# Patient Record
Sex: Female | Born: 1975
Health system: Southern US, Community
[De-identification: ages and names within clinical notes are randomized; demographics above are authoritative.]

## PROBLEM LIST (undated history)

## (undated) DIAGNOSIS — F419 Anxiety disorder, unspecified: Secondary | ICD-10-CM

## (undated) HISTORY — PX: ABLATION: SHX5711

## (undated) HISTORY — DX: Anxiety disorder, unspecified: F41.9

## (undated) HISTORY — PX: ABDOMINAL HYSTERECTOMY: SHX81

## (undated) HISTORY — PX: FOOT SURGERY: SHX648

---

## 2006-10-18 ENCOUNTER — Ambulatory Visit: Payer: Self-pay | Admitting: Podiatry

## 2007-11-17 ENCOUNTER — Encounter: Payer: Self-pay | Admitting: Obstetrics and Gynecology

## 2007-12-29 ENCOUNTER — Encounter: Payer: Self-pay | Admitting: Obstetrics and Gynecology

## 2008-01-13 ENCOUNTER — Ambulatory Visit: Payer: Self-pay | Admitting: Certified Nurse Midwife

## 2008-01-15 ENCOUNTER — Ambulatory Visit: Payer: Self-pay | Admitting: Maternal & Fetal Medicine

## 2008-03-22 ENCOUNTER — Ambulatory Visit: Payer: Self-pay

## 2008-04-29 ENCOUNTER — Encounter: Payer: Self-pay | Admitting: Maternal & Fetal Medicine

## 2008-05-28 ENCOUNTER — Inpatient Hospital Stay: Payer: Self-pay | Admitting: Obstetrics and Gynecology

## 2009-09-02 ENCOUNTER — Emergency Department: Payer: Self-pay | Admitting: Emergency Medicine

## 2010-04-15 ENCOUNTER — Ambulatory Visit: Payer: Self-pay | Admitting: Unknown Physician Specialty

## 2010-05-08 ENCOUNTER — Ambulatory Visit: Payer: Self-pay

## 2010-07-06 ENCOUNTER — Inpatient Hospital Stay: Payer: Self-pay

## 2010-07-11 LAB — PATHOLOGY REPORT

## 2012-04-20 ENCOUNTER — Emergency Department: Payer: Self-pay | Admitting: Emergency Medicine

## 2012-05-28 DIAGNOSIS — D239 Other benign neoplasm of skin, unspecified: Secondary | ICD-10-CM

## 2012-05-28 HISTORY — DX: Other benign neoplasm of skin, unspecified: D23.9

## 2013-12-31 HISTORY — PX: HYSTERECTOMY ABDOMINAL WITH SALPINGECTOMY: SHX6725

## 2014-11-22 ENCOUNTER — Ambulatory Visit: Payer: Self-pay | Admitting: Obstetrics & Gynecology

## 2014-11-22 LAB — CBC
HCT: 39 % (ref 35.0–47.0)
HGB: 12.7 g/dL (ref 12.0–16.0)
MCH: 31.1 pg (ref 26.0–34.0)
MCHC: 32.7 g/dL (ref 32.0–36.0)
MCV: 95 fL (ref 80–100)
Platelet: 208 10*3/uL (ref 150–440)
RBC: 4.1 10*6/uL (ref 3.80–5.20)
RDW: 13 % (ref 11.5–14.5)
WBC: 8.2 10*3/uL (ref 3.6–11.0)

## 2014-12-03 ENCOUNTER — Ambulatory Visit: Payer: Self-pay | Admitting: Obstetrics & Gynecology

## 2014-12-04 LAB — HEMOGLOBIN: HGB: 10.6 g/dL — ABNORMAL LOW (ref 12.0–16.0)

## 2015-04-23 NOTE — Op Note (Signed)
PATIENT NAME:  Vanessa Kim, Vanessa Kim MR#:  [id number] DATE OF BIRTH:  [birth year]  DATE OF PROCEDURE:  12/03/2014  PREOPERATIVE DIAGNOSIS: Chronic pelvic pain.  POSTOPERATIVE DIAGNOSIS: Chronic pelvic pain.   PROCEDURE PERFORMED: Complete laparoscopic hysterectomy with bilateral salpingectomy, and cystoscopy.   SURGEON: Glean Salen, MD   ASSISTANT: Glennon Mac   ANESTHESIA: General.   ESTIMATED BLOOD LOSS: 810 mL   COMPLICATIONS: None.   FINDINGS: Right hydrosalpinx, left ovarian cyst. Normal cystoscopy with patent flow out each ureter.   DISPOSITION: To the recovery room stable.   TECHNIQUE: The patient was prepped and draped in the usual sterile fashion. After adequate anesthesia was obtained, in the dorsal lithotomy position, a bladder Foley catheter was placed and a speculum was then applied vaginally, with grasping of the cervix with a tenaculum. A V-Care device was placed, after the uterus was sounded to 8 cm.   Attention was then turned to the abdomen, where a Veress needle was inserted through a 5 mm infraumbilical incision and Marcaine was used to anesthetize the skin. Veress needle placement was confirmed using the hanging drop technique, and the abdomen was then insufflated with CO2 gas. A 5 mm trocar was then inserted under direct visualization with the laparoscope, with no injuries or bleeding noted.   The patient is placed in Trendelenburg positioning. The above-mentioned pathological findings were visualized. A small perforation of the uterus with the V-Care device was visualized, but there was no bleeding, injury, or lateral injury noted; it was confined to the uterus.   Right 11 mm incision and trocar was placed, and a left 5 mm incision and trocar was placed for laparoscopic instrumentation purposes. Using the 5 mm Harmonic scalpel, the fallopian tube on each side was carefully dissected free from the mesosalpinx, and away from the infundibulopelvic blood vessels and ligaments,  without compromise of the blood flow to each ovary. The round ligaments were then carefully coagulated and cut. The uterine ovarian blood vessels and ligaments were then carefully coagulated and cut, to completely sever the ovary from the uterus, but with preserving the ovaries and their blood supplies. The dissection was carried down to the level of the uterine arteries, which were carefully coagulated with the Kleppinger bipolar cautery device and then cut at the level of the VCare device. Visualization of the ureter, as well as bowel and colon, reveals no injury or harm. The V-Care device was then identify at the cervical vaginal junction, and was carefully incised using a Harmonic scalpel to completely amputate the uterus, cervix, and fallopian tubes, which was then removed vaginally.   Using the Endo Stitch device, the vaginal cuff was closed with 0 Polysorb sutures in an interrupted fashion. Examination vaginally with the speculum exam reveals a small area at the inferior pole of the incision with separation and escape of gas, and so a vaginal suture using 2 Vicryl sutures was then placed to complete the closure. While vaginal, the Foley catheter was taken out, and a cystoscopy was performed with saline distention of the bladder. Urine flow was seen to emit from each ureteral orifice on the right and left sides. The cystoscope was removed, and Foley catheter was replaced.   The pelvic cavity was irrigated with saline with aspiration of all fluid, and excellent hemostasis was noted. Arista was placed on several areas around the vaginal cuff, and just lateral to that, to ensure adequate hemostasis. The patient was leveled. Gas was expelled. Trocars were removed and the abdominal incisions were closed  with a skin glue preparation. The Foley catheter was left in place.   The patient went to the recovery room in stable condition. Specimen was sent to pathology for further review. The patient tolerated  procedure well and went to the recovery room in stable condition. All sponge, instrument, and needle counts were correct.    ____________________________ R. Barnett Applebaum, MD rph:MT D: 12/03/2014 12:40:02 ET T: 12/03/2014 13:00:00 ET JOB#: [id number]  cc: Glean Salen, MD, <Dictator> Gae Dry MD ELECTRONICALLY SIGNED 12/04/2014 9:29

## 2015-04-25 LAB — SURGICAL PATHOLOGY

## 2015-11-28 ENCOUNTER — Encounter: Payer: Self-pay | Admitting: Physician Assistant

## 2015-11-28 ENCOUNTER — Ambulatory Visit: Payer: Self-pay | Admitting: Physician Assistant

## 2015-11-28 VITALS — BP 140/98 | HR 86 | Temp 98.3°F

## 2015-11-28 DIAGNOSIS — J069 Acute upper respiratory infection, unspecified: Secondary | ICD-10-CM

## 2015-11-28 MED ORDER — AZITHROMYCIN 250 MG PO TABS: ORAL_TABLET | ORAL | Status: DC

## 2015-11-28 MED ORDER — PREDNISONE 10 MG PO TABS: 30.0000 mg | ORAL_TABLET | Freq: Every day | ORAL | Status: DC

## 2015-11-28 NOTE — Progress Notes (Signed)
S: C/o runny nose and congestion for 3 days, no fever, chills, cp/sob, v/d; mucus is green and thick, c/o of facial and dental pain.   Using otc meds:   O: PE: perrl eomi, normocephalic, tms dull, nasal mucosa red and swollen, throat injected, neck supple no lymph, lungs c t a, cv rrr, neuro intact  A:  Acute sinusitis   P: zpack, prednisone 30mg  qd x 3d,  drink fluids, continue regular meds , use otc meds of choice, return if not improving in 5 days, return earlier if worsening

## 2016-01-10 DIAGNOSIS — J301 Allergic rhinitis due to pollen: Secondary | ICD-10-CM | POA: Diagnosis not present

## 2016-01-10 DIAGNOSIS — J3081 Allergic rhinitis due to animal (cat) (dog) hair and dander: Secondary | ICD-10-CM | POA: Diagnosis not present

## 2016-01-10 DIAGNOSIS — J3089 Other allergic rhinitis: Secondary | ICD-10-CM | POA: Diagnosis not present

## 2016-01-12 DIAGNOSIS — J3081 Allergic rhinitis due to animal (cat) (dog) hair and dander: Secondary | ICD-10-CM | POA: Diagnosis not present

## 2016-01-19 DIAGNOSIS — J301 Allergic rhinitis due to pollen: Secondary | ICD-10-CM | POA: Diagnosis not present

## 2016-01-19 DIAGNOSIS — J3089 Other allergic rhinitis: Secondary | ICD-10-CM | POA: Diagnosis not present

## 2016-01-19 DIAGNOSIS — J3081 Allergic rhinitis due to animal (cat) (dog) hair and dander: Secondary | ICD-10-CM | POA: Diagnosis not present

## 2016-01-23 DIAGNOSIS — J3081 Allergic rhinitis due to animal (cat) (dog) hair and dander: Secondary | ICD-10-CM | POA: Diagnosis not present

## 2016-01-23 DIAGNOSIS — J3089 Other allergic rhinitis: Secondary | ICD-10-CM | POA: Diagnosis not present

## 2016-01-23 DIAGNOSIS — J301 Allergic rhinitis due to pollen: Secondary | ICD-10-CM | POA: Diagnosis not present

## 2016-01-26 DIAGNOSIS — J3081 Allergic rhinitis due to animal (cat) (dog) hair and dander: Secondary | ICD-10-CM | POA: Diagnosis not present

## 2016-01-26 DIAGNOSIS — J301 Allergic rhinitis due to pollen: Secondary | ICD-10-CM | POA: Diagnosis not present

## 2016-01-26 DIAGNOSIS — J3089 Other allergic rhinitis: Secondary | ICD-10-CM | POA: Diagnosis not present

## 2016-01-31 DIAGNOSIS — J3081 Allergic rhinitis due to animal (cat) (dog) hair and dander: Secondary | ICD-10-CM | POA: Diagnosis not present

## 2016-01-31 DIAGNOSIS — J301 Allergic rhinitis due to pollen: Secondary | ICD-10-CM | POA: Diagnosis not present

## 2016-01-31 DIAGNOSIS — J3089 Other allergic rhinitis: Secondary | ICD-10-CM | POA: Diagnosis not present

## 2016-02-07 DIAGNOSIS — J301 Allergic rhinitis due to pollen: Secondary | ICD-10-CM | POA: Diagnosis not present

## 2016-02-07 DIAGNOSIS — J3081 Allergic rhinitis due to animal (cat) (dog) hair and dander: Secondary | ICD-10-CM | POA: Diagnosis not present

## 2016-02-07 DIAGNOSIS — J3089 Other allergic rhinitis: Secondary | ICD-10-CM | POA: Diagnosis not present

## 2016-02-14 DIAGNOSIS — F411 Generalized anxiety disorder: Secondary | ICD-10-CM | POA: Diagnosis not present

## 2016-02-14 DIAGNOSIS — J3081 Allergic rhinitis due to animal (cat) (dog) hair and dander: Secondary | ICD-10-CM | POA: Diagnosis not present

## 2016-02-14 DIAGNOSIS — J301 Allergic rhinitis due to pollen: Secondary | ICD-10-CM | POA: Diagnosis not present

## 2016-02-14 DIAGNOSIS — J3089 Other allergic rhinitis: Secondary | ICD-10-CM | POA: Diagnosis not present

## 2016-02-14 DIAGNOSIS — F988 Other specified behavioral and emotional disorders with onset usually occurring in childhood and adolescence: Secondary | ICD-10-CM | POA: Diagnosis not present

## 2016-02-23 DIAGNOSIS — J301 Allergic rhinitis due to pollen: Secondary | ICD-10-CM | POA: Diagnosis not present

## 2016-02-23 DIAGNOSIS — J3089 Other allergic rhinitis: Secondary | ICD-10-CM | POA: Diagnosis not present

## 2016-02-23 DIAGNOSIS — J3081 Allergic rhinitis due to animal (cat) (dog) hair and dander: Secondary | ICD-10-CM | POA: Diagnosis not present

## 2016-02-28 DIAGNOSIS — J3081 Allergic rhinitis due to animal (cat) (dog) hair and dander: Secondary | ICD-10-CM | POA: Diagnosis not present

## 2016-02-28 DIAGNOSIS — J3089 Other allergic rhinitis: Secondary | ICD-10-CM | POA: Diagnosis not present

## 2016-02-28 DIAGNOSIS — J301 Allergic rhinitis due to pollen: Secondary | ICD-10-CM | POA: Diagnosis not present

## 2016-03-08 DIAGNOSIS — J301 Allergic rhinitis due to pollen: Secondary | ICD-10-CM | POA: Diagnosis not present

## 2016-03-08 DIAGNOSIS — H1045 Other chronic allergic conjunctivitis: Secondary | ICD-10-CM | POA: Diagnosis not present

## 2016-03-08 DIAGNOSIS — J3081 Allergic rhinitis due to animal (cat) (dog) hair and dander: Secondary | ICD-10-CM | POA: Diagnosis not present

## 2016-03-08 DIAGNOSIS — J3089 Other allergic rhinitis: Secondary | ICD-10-CM | POA: Diagnosis not present

## 2016-03-13 DIAGNOSIS — J301 Allergic rhinitis due to pollen: Secondary | ICD-10-CM | POA: Diagnosis not present

## 2016-03-13 DIAGNOSIS — J3081 Allergic rhinitis due to animal (cat) (dog) hair and dander: Secondary | ICD-10-CM | POA: Diagnosis not present

## 2016-03-13 DIAGNOSIS — J3089 Other allergic rhinitis: Secondary | ICD-10-CM | POA: Diagnosis not present

## 2016-03-22 DIAGNOSIS — J3081 Allergic rhinitis due to animal (cat) (dog) hair and dander: Secondary | ICD-10-CM | POA: Diagnosis not present

## 2016-03-22 DIAGNOSIS — J301 Allergic rhinitis due to pollen: Secondary | ICD-10-CM | POA: Diagnosis not present

## 2016-03-22 DIAGNOSIS — J3089 Other allergic rhinitis: Secondary | ICD-10-CM | POA: Diagnosis not present

## 2016-03-27 DIAGNOSIS — J3081 Allergic rhinitis due to animal (cat) (dog) hair and dander: Secondary | ICD-10-CM | POA: Diagnosis not present

## 2016-03-27 DIAGNOSIS — J301 Allergic rhinitis due to pollen: Secondary | ICD-10-CM | POA: Diagnosis not present

## 2016-03-27 DIAGNOSIS — J3089 Other allergic rhinitis: Secondary | ICD-10-CM | POA: Diagnosis not present

## 2016-04-05 DIAGNOSIS — J3089 Other allergic rhinitis: Secondary | ICD-10-CM | POA: Diagnosis not present

## 2016-04-05 DIAGNOSIS — J301 Allergic rhinitis due to pollen: Secondary | ICD-10-CM | POA: Diagnosis not present

## 2016-04-05 DIAGNOSIS — J3081 Allergic rhinitis due to animal (cat) (dog) hair and dander: Secondary | ICD-10-CM | POA: Diagnosis not present

## 2016-04-11 ENCOUNTER — Encounter: Payer: Self-pay | Admitting: *Deleted

## 2016-04-12 DIAGNOSIS — J3081 Allergic rhinitis due to animal (cat) (dog) hair and dander: Secondary | ICD-10-CM | POA: Diagnosis not present

## 2016-04-12 DIAGNOSIS — J3089 Other allergic rhinitis: Secondary | ICD-10-CM | POA: Diagnosis not present

## 2016-04-12 DIAGNOSIS — J301 Allergic rhinitis due to pollen: Secondary | ICD-10-CM | POA: Diagnosis not present

## 2016-04-17 DIAGNOSIS — J301 Allergic rhinitis due to pollen: Secondary | ICD-10-CM | POA: Diagnosis not present

## 2016-04-17 DIAGNOSIS — J3089 Other allergic rhinitis: Secondary | ICD-10-CM | POA: Diagnosis not present

## 2016-04-17 DIAGNOSIS — J3081 Allergic rhinitis due to animal (cat) (dog) hair and dander: Secondary | ICD-10-CM | POA: Diagnosis not present

## 2016-04-24 DIAGNOSIS — J3089 Other allergic rhinitis: Secondary | ICD-10-CM | POA: Diagnosis not present

## 2016-04-24 DIAGNOSIS — J3081 Allergic rhinitis due to animal (cat) (dog) hair and dander: Secondary | ICD-10-CM | POA: Diagnosis not present

## 2016-04-24 DIAGNOSIS — J301 Allergic rhinitis due to pollen: Secondary | ICD-10-CM | POA: Diagnosis not present

## 2016-05-04 DIAGNOSIS — J301 Allergic rhinitis due to pollen: Secondary | ICD-10-CM | POA: Diagnosis not present

## 2016-05-04 DIAGNOSIS — J3089 Other allergic rhinitis: Secondary | ICD-10-CM | POA: Diagnosis not present

## 2016-05-04 DIAGNOSIS — J3081 Allergic rhinitis due to animal (cat) (dog) hair and dander: Secondary | ICD-10-CM | POA: Diagnosis not present

## 2016-05-08 DIAGNOSIS — J301 Allergic rhinitis due to pollen: Secondary | ICD-10-CM | POA: Diagnosis not present

## 2016-05-08 DIAGNOSIS — J3081 Allergic rhinitis due to animal (cat) (dog) hair and dander: Secondary | ICD-10-CM | POA: Diagnosis not present

## 2016-05-08 DIAGNOSIS — J3089 Other allergic rhinitis: Secondary | ICD-10-CM | POA: Diagnosis not present

## 2016-05-17 DIAGNOSIS — J301 Allergic rhinitis due to pollen: Secondary | ICD-10-CM | POA: Diagnosis not present

## 2016-05-17 DIAGNOSIS — J3081 Allergic rhinitis due to animal (cat) (dog) hair and dander: Secondary | ICD-10-CM | POA: Diagnosis not present

## 2016-05-17 DIAGNOSIS — J3089 Other allergic rhinitis: Secondary | ICD-10-CM | POA: Diagnosis not present

## 2016-05-22 DIAGNOSIS — J3081 Allergic rhinitis due to animal (cat) (dog) hair and dander: Secondary | ICD-10-CM | POA: Diagnosis not present

## 2016-05-22 DIAGNOSIS — J301 Allergic rhinitis due to pollen: Secondary | ICD-10-CM | POA: Diagnosis not present

## 2016-05-22 DIAGNOSIS — J3089 Other allergic rhinitis: Secondary | ICD-10-CM | POA: Diagnosis not present

## 2016-06-01 DIAGNOSIS — J3089 Other allergic rhinitis: Secondary | ICD-10-CM | POA: Diagnosis not present

## 2016-06-01 DIAGNOSIS — J301 Allergic rhinitis due to pollen: Secondary | ICD-10-CM | POA: Diagnosis not present

## 2016-06-01 DIAGNOSIS — J3081 Allergic rhinitis due to animal (cat) (dog) hair and dander: Secondary | ICD-10-CM | POA: Diagnosis not present

## 2016-06-07 DIAGNOSIS — R5383 Other fatigue: Secondary | ICD-10-CM | POA: Diagnosis not present

## 2016-06-07 DIAGNOSIS — J301 Allergic rhinitis due to pollen: Secondary | ICD-10-CM | POA: Diagnosis not present

## 2016-06-07 DIAGNOSIS — J3089 Other allergic rhinitis: Secondary | ICD-10-CM | POA: Diagnosis not present

## 2016-06-07 DIAGNOSIS — J3081 Allergic rhinitis due to animal (cat) (dog) hair and dander: Secondary | ICD-10-CM | POA: Diagnosis not present

## 2016-07-05 DIAGNOSIS — J3089 Other allergic rhinitis: Secondary | ICD-10-CM | POA: Diagnosis not present

## 2016-07-05 DIAGNOSIS — J3081 Allergic rhinitis due to animal (cat) (dog) hair and dander: Secondary | ICD-10-CM | POA: Diagnosis not present

## 2016-07-05 DIAGNOSIS — J301 Allergic rhinitis due to pollen: Secondary | ICD-10-CM | POA: Diagnosis not present

## 2016-07-09 DIAGNOSIS — J3081 Allergic rhinitis due to animal (cat) (dog) hair and dander: Secondary | ICD-10-CM | POA: Diagnosis not present

## 2016-07-09 DIAGNOSIS — J3089 Other allergic rhinitis: Secondary | ICD-10-CM | POA: Diagnosis not present

## 2016-07-10 DIAGNOSIS — J3081 Allergic rhinitis due to animal (cat) (dog) hair and dander: Secondary | ICD-10-CM | POA: Diagnosis not present

## 2016-07-10 DIAGNOSIS — J3089 Other allergic rhinitis: Secondary | ICD-10-CM | POA: Diagnosis not present

## 2016-07-10 DIAGNOSIS — J301 Allergic rhinitis due to pollen: Secondary | ICD-10-CM | POA: Diagnosis not present

## 2016-07-20 DIAGNOSIS — J301 Allergic rhinitis due to pollen: Secondary | ICD-10-CM | POA: Diagnosis not present

## 2016-07-20 DIAGNOSIS — J3089 Other allergic rhinitis: Secondary | ICD-10-CM | POA: Diagnosis not present

## 2016-07-20 DIAGNOSIS — J3081 Allergic rhinitis due to animal (cat) (dog) hair and dander: Secondary | ICD-10-CM | POA: Diagnosis not present

## 2016-07-26 DIAGNOSIS — J3081 Allergic rhinitis due to animal (cat) (dog) hair and dander: Secondary | ICD-10-CM | POA: Diagnosis not present

## 2016-07-26 DIAGNOSIS — J301 Allergic rhinitis due to pollen: Secondary | ICD-10-CM | POA: Diagnosis not present

## 2016-07-26 DIAGNOSIS — J3089 Other allergic rhinitis: Secondary | ICD-10-CM | POA: Diagnosis not present

## 2016-07-29 ENCOUNTER — Telehealth: Payer: Self-pay | Admitting: Family

## 2016-07-29 DIAGNOSIS — J019 Acute sinusitis, unspecified: Secondary | ICD-10-CM

## 2016-07-29 MED ORDER — AMOXICILLIN-POT CLAVULANATE 875-125 MG PO TABS: 1.0000 | ORAL_TABLET | Freq: Two times a day (BID) | ORAL | 0 refills | Status: DC

## 2016-07-29 NOTE — Progress Notes (Signed)

## 2016-08-09 DIAGNOSIS — J3081 Allergic rhinitis due to animal (cat) (dog) hair and dander: Secondary | ICD-10-CM | POA: Diagnosis not present

## 2016-08-09 DIAGNOSIS — J3089 Other allergic rhinitis: Secondary | ICD-10-CM | POA: Diagnosis not present

## 2016-08-09 DIAGNOSIS — J301 Allergic rhinitis due to pollen: Secondary | ICD-10-CM | POA: Diagnosis not present

## 2016-08-14 DIAGNOSIS — J3081 Allergic rhinitis due to animal (cat) (dog) hair and dander: Secondary | ICD-10-CM | POA: Diagnosis not present

## 2016-08-14 DIAGNOSIS — J3089 Other allergic rhinitis: Secondary | ICD-10-CM | POA: Diagnosis not present

## 2016-08-14 DIAGNOSIS — J301 Allergic rhinitis due to pollen: Secondary | ICD-10-CM | POA: Diagnosis not present

## 2016-08-17 DIAGNOSIS — J3081 Allergic rhinitis due to animal (cat) (dog) hair and dander: Secondary | ICD-10-CM | POA: Diagnosis not present

## 2016-08-17 DIAGNOSIS — J3089 Other allergic rhinitis: Secondary | ICD-10-CM | POA: Diagnosis not present

## 2016-08-17 DIAGNOSIS — J301 Allergic rhinitis due to pollen: Secondary | ICD-10-CM | POA: Diagnosis not present

## 2016-08-23 DIAGNOSIS — J301 Allergic rhinitis due to pollen: Secondary | ICD-10-CM | POA: Diagnosis not present

## 2016-08-23 DIAGNOSIS — J3089 Other allergic rhinitis: Secondary | ICD-10-CM | POA: Diagnosis not present

## 2016-08-23 DIAGNOSIS — J3081 Allergic rhinitis due to animal (cat) (dog) hair and dander: Secondary | ICD-10-CM | POA: Diagnosis not present

## 2016-08-31 DIAGNOSIS — J301 Allergic rhinitis due to pollen: Secondary | ICD-10-CM | POA: Diagnosis not present

## 2016-08-31 DIAGNOSIS — J3089 Other allergic rhinitis: Secondary | ICD-10-CM | POA: Diagnosis not present

## 2016-08-31 DIAGNOSIS — J3081 Allergic rhinitis due to animal (cat) (dog) hair and dander: Secondary | ICD-10-CM | POA: Diagnosis not present

## 2016-09-06 DIAGNOSIS — J301 Allergic rhinitis due to pollen: Secondary | ICD-10-CM | POA: Diagnosis not present

## 2016-09-06 DIAGNOSIS — J3089 Other allergic rhinitis: Secondary | ICD-10-CM | POA: Diagnosis not present

## 2016-09-06 DIAGNOSIS — J3081 Allergic rhinitis due to animal (cat) (dog) hair and dander: Secondary | ICD-10-CM | POA: Diagnosis not present

## 2016-09-11 DIAGNOSIS — J3081 Allergic rhinitis due to animal (cat) (dog) hair and dander: Secondary | ICD-10-CM | POA: Diagnosis not present

## 2016-09-11 DIAGNOSIS — J3089 Other allergic rhinitis: Secondary | ICD-10-CM | POA: Diagnosis not present

## 2016-09-11 DIAGNOSIS — J301 Allergic rhinitis due to pollen: Secondary | ICD-10-CM | POA: Diagnosis not present

## 2016-09-14 DIAGNOSIS — J3081 Allergic rhinitis due to animal (cat) (dog) hair and dander: Secondary | ICD-10-CM | POA: Diagnosis not present

## 2016-09-14 DIAGNOSIS — J3089 Other allergic rhinitis: Secondary | ICD-10-CM | POA: Diagnosis not present

## 2016-09-14 DIAGNOSIS — J301 Allergic rhinitis due to pollen: Secondary | ICD-10-CM | POA: Diagnosis not present

## 2016-09-21 DIAGNOSIS — J301 Allergic rhinitis due to pollen: Secondary | ICD-10-CM | POA: Diagnosis not present

## 2016-09-21 DIAGNOSIS — J3089 Other allergic rhinitis: Secondary | ICD-10-CM | POA: Diagnosis not present

## 2016-09-21 DIAGNOSIS — J3081 Allergic rhinitis due to animal (cat) (dog) hair and dander: Secondary | ICD-10-CM | POA: Diagnosis not present

## 2016-09-25 DIAGNOSIS — J301 Allergic rhinitis due to pollen: Secondary | ICD-10-CM | POA: Diagnosis not present

## 2016-09-25 DIAGNOSIS — J3081 Allergic rhinitis due to animal (cat) (dog) hair and dander: Secondary | ICD-10-CM | POA: Diagnosis not present

## 2016-09-25 DIAGNOSIS — J3089 Other allergic rhinitis: Secondary | ICD-10-CM | POA: Diagnosis not present

## 2016-10-05 DIAGNOSIS — J3081 Allergic rhinitis due to animal (cat) (dog) hair and dander: Secondary | ICD-10-CM | POA: Diagnosis not present

## 2016-10-05 DIAGNOSIS — J301 Allergic rhinitis due to pollen: Secondary | ICD-10-CM | POA: Diagnosis not present

## 2016-10-05 DIAGNOSIS — J3089 Other allergic rhinitis: Secondary | ICD-10-CM | POA: Diagnosis not present

## 2016-10-09 DIAGNOSIS — J3081 Allergic rhinitis due to animal (cat) (dog) hair and dander: Secondary | ICD-10-CM | POA: Diagnosis not present

## 2016-10-09 DIAGNOSIS — J3089 Other allergic rhinitis: Secondary | ICD-10-CM | POA: Diagnosis not present

## 2016-10-09 DIAGNOSIS — J301 Allergic rhinitis due to pollen: Secondary | ICD-10-CM | POA: Diagnosis not present

## 2016-10-11 DIAGNOSIS — J301 Allergic rhinitis due to pollen: Secondary | ICD-10-CM | POA: Diagnosis not present

## 2016-10-11 DIAGNOSIS — J3089 Other allergic rhinitis: Secondary | ICD-10-CM | POA: Diagnosis not present

## 2016-10-11 DIAGNOSIS — J3081 Allergic rhinitis due to animal (cat) (dog) hair and dander: Secondary | ICD-10-CM | POA: Diagnosis not present

## 2016-10-18 DIAGNOSIS — J301 Allergic rhinitis due to pollen: Secondary | ICD-10-CM | POA: Diagnosis not present

## 2016-10-18 DIAGNOSIS — J3089 Other allergic rhinitis: Secondary | ICD-10-CM | POA: Diagnosis not present

## 2016-10-18 DIAGNOSIS — J3081 Allergic rhinitis due to animal (cat) (dog) hair and dander: Secondary | ICD-10-CM | POA: Diagnosis not present

## 2016-10-25 DIAGNOSIS — J301 Allergic rhinitis due to pollen: Secondary | ICD-10-CM | POA: Diagnosis not present

## 2016-10-25 DIAGNOSIS — J3089 Other allergic rhinitis: Secondary | ICD-10-CM | POA: Diagnosis not present

## 2016-11-02 DIAGNOSIS — J3089 Other allergic rhinitis: Secondary | ICD-10-CM | POA: Diagnosis not present

## 2016-11-02 DIAGNOSIS — J3081 Allergic rhinitis due to animal (cat) (dog) hair and dander: Secondary | ICD-10-CM | POA: Diagnosis not present

## 2016-11-02 DIAGNOSIS — J301 Allergic rhinitis due to pollen: Secondary | ICD-10-CM | POA: Diagnosis not present

## 2016-11-06 DIAGNOSIS — J301 Allergic rhinitis due to pollen: Secondary | ICD-10-CM | POA: Diagnosis not present

## 2016-11-06 DIAGNOSIS — J3089 Other allergic rhinitis: Secondary | ICD-10-CM | POA: Diagnosis not present

## 2016-11-06 DIAGNOSIS — J3081 Allergic rhinitis due to animal (cat) (dog) hair and dander: Secondary | ICD-10-CM | POA: Diagnosis not present

## 2016-11-20 DIAGNOSIS — J3089 Other allergic rhinitis: Secondary | ICD-10-CM | POA: Diagnosis not present

## 2016-11-20 DIAGNOSIS — J3081 Allergic rhinitis due to animal (cat) (dog) hair and dander: Secondary | ICD-10-CM | POA: Diagnosis not present

## 2016-11-20 DIAGNOSIS — J301 Allergic rhinitis due to pollen: Secondary | ICD-10-CM | POA: Diagnosis not present

## 2016-12-04 DIAGNOSIS — J3081 Allergic rhinitis due to animal (cat) (dog) hair and dander: Secondary | ICD-10-CM | POA: Diagnosis not present

## 2016-12-04 DIAGNOSIS — J301 Allergic rhinitis due to pollen: Secondary | ICD-10-CM | POA: Diagnosis not present

## 2016-12-04 DIAGNOSIS — J3089 Other allergic rhinitis: Secondary | ICD-10-CM | POA: Diagnosis not present

## 2016-12-13 DIAGNOSIS — J301 Allergic rhinitis due to pollen: Secondary | ICD-10-CM | POA: Diagnosis not present

## 2016-12-13 DIAGNOSIS — J3081 Allergic rhinitis due to animal (cat) (dog) hair and dander: Secondary | ICD-10-CM | POA: Diagnosis not present

## 2016-12-13 DIAGNOSIS — J3089 Other allergic rhinitis: Secondary | ICD-10-CM | POA: Diagnosis not present

## 2016-12-20 DIAGNOSIS — J301 Allergic rhinitis due to pollen: Secondary | ICD-10-CM | POA: Diagnosis not present

## 2016-12-20 DIAGNOSIS — J3081 Allergic rhinitis due to animal (cat) (dog) hair and dander: Secondary | ICD-10-CM | POA: Diagnosis not present

## 2016-12-20 DIAGNOSIS — J3089 Other allergic rhinitis: Secondary | ICD-10-CM | POA: Diagnosis not present

## 2017-01-02 ENCOUNTER — Telehealth: Payer: Self-pay | Admitting: Family

## 2017-01-02 DIAGNOSIS — J019 Acute sinusitis, unspecified: Secondary | ICD-10-CM

## 2017-01-02 MED ORDER — AMOXICILLIN-POT CLAVULANATE 875-125 MG PO TABS: 1.0000 | ORAL_TABLET | Freq: Two times a day (BID) | ORAL | 0 refills | Status: DC

## 2017-01-02 NOTE — Progress Notes (Signed)

## 2017-01-11 DIAGNOSIS — J3081 Allergic rhinitis due to animal (cat) (dog) hair and dander: Secondary | ICD-10-CM | POA: Diagnosis not present

## 2017-01-11 DIAGNOSIS — J301 Allergic rhinitis due to pollen: Secondary | ICD-10-CM | POA: Diagnosis not present

## 2017-01-11 DIAGNOSIS — J3089 Other allergic rhinitis: Secondary | ICD-10-CM | POA: Diagnosis not present

## 2017-01-14 DIAGNOSIS — J3081 Allergic rhinitis due to animal (cat) (dog) hair and dander: Secondary | ICD-10-CM | POA: Diagnosis not present

## 2017-01-14 DIAGNOSIS — J301 Allergic rhinitis due to pollen: Secondary | ICD-10-CM | POA: Diagnosis not present

## 2017-01-14 DIAGNOSIS — J3089 Other allergic rhinitis: Secondary | ICD-10-CM | POA: Diagnosis not present

## 2017-01-22 DIAGNOSIS — J3089 Other allergic rhinitis: Secondary | ICD-10-CM | POA: Diagnosis not present

## 2017-01-22 DIAGNOSIS — J3081 Allergic rhinitis due to animal (cat) (dog) hair and dander: Secondary | ICD-10-CM | POA: Diagnosis not present

## 2017-01-22 DIAGNOSIS — J301 Allergic rhinitis due to pollen: Secondary | ICD-10-CM | POA: Diagnosis not present

## 2017-02-07 DIAGNOSIS — J3089 Other allergic rhinitis: Secondary | ICD-10-CM | POA: Diagnosis not present

## 2017-02-07 DIAGNOSIS — J3081 Allergic rhinitis due to animal (cat) (dog) hair and dander: Secondary | ICD-10-CM | POA: Diagnosis not present

## 2017-02-07 DIAGNOSIS — J301 Allergic rhinitis due to pollen: Secondary | ICD-10-CM | POA: Diagnosis not present

## 2017-03-01 DIAGNOSIS — J3081 Allergic rhinitis due to animal (cat) (dog) hair and dander: Secondary | ICD-10-CM | POA: Diagnosis not present

## 2017-03-01 DIAGNOSIS — J301 Allergic rhinitis due to pollen: Secondary | ICD-10-CM | POA: Diagnosis not present

## 2017-03-01 DIAGNOSIS — J3089 Other allergic rhinitis: Secondary | ICD-10-CM | POA: Diagnosis not present

## 2017-03-07 DIAGNOSIS — J301 Allergic rhinitis due to pollen: Secondary | ICD-10-CM | POA: Diagnosis not present

## 2017-03-07 DIAGNOSIS — J3081 Allergic rhinitis due to animal (cat) (dog) hair and dander: Secondary | ICD-10-CM | POA: Diagnosis not present

## 2017-03-07 DIAGNOSIS — J3089 Other allergic rhinitis: Secondary | ICD-10-CM | POA: Diagnosis not present

## 2017-03-14 DIAGNOSIS — J301 Allergic rhinitis due to pollen: Secondary | ICD-10-CM | POA: Diagnosis not present

## 2017-03-14 DIAGNOSIS — J3081 Allergic rhinitis due to animal (cat) (dog) hair and dander: Secondary | ICD-10-CM | POA: Diagnosis not present

## 2017-03-14 DIAGNOSIS — J3089 Other allergic rhinitis: Secondary | ICD-10-CM | POA: Diagnosis not present

## 2017-03-14 DIAGNOSIS — H1045 Other chronic allergic conjunctivitis: Secondary | ICD-10-CM | POA: Diagnosis not present

## 2017-03-19 DIAGNOSIS — J3089 Other allergic rhinitis: Secondary | ICD-10-CM | POA: Diagnosis not present

## 2017-03-19 DIAGNOSIS — J301 Allergic rhinitis due to pollen: Secondary | ICD-10-CM | POA: Diagnosis not present

## 2017-03-19 DIAGNOSIS — J3081 Allergic rhinitis due to animal (cat) (dog) hair and dander: Secondary | ICD-10-CM | POA: Diagnosis not present

## 2017-03-21 DIAGNOSIS — J3081 Allergic rhinitis due to animal (cat) (dog) hair and dander: Secondary | ICD-10-CM | POA: Diagnosis not present

## 2017-03-21 DIAGNOSIS — J301 Allergic rhinitis due to pollen: Secondary | ICD-10-CM | POA: Diagnosis not present

## 2017-03-21 DIAGNOSIS — J3089 Other allergic rhinitis: Secondary | ICD-10-CM | POA: Diagnosis not present

## 2017-03-28 DIAGNOSIS — J301 Allergic rhinitis due to pollen: Secondary | ICD-10-CM | POA: Diagnosis not present

## 2017-03-28 DIAGNOSIS — J3089 Other allergic rhinitis: Secondary | ICD-10-CM | POA: Diagnosis not present

## 2017-03-28 DIAGNOSIS — J3081 Allergic rhinitis due to animal (cat) (dog) hair and dander: Secondary | ICD-10-CM | POA: Diagnosis not present

## 2017-04-04 DIAGNOSIS — J301 Allergic rhinitis due to pollen: Secondary | ICD-10-CM | POA: Diagnosis not present

## 2017-04-09 DIAGNOSIS — J3081 Allergic rhinitis due to animal (cat) (dog) hair and dander: Secondary | ICD-10-CM | POA: Diagnosis not present

## 2017-04-09 DIAGNOSIS — J301 Allergic rhinitis due to pollen: Secondary | ICD-10-CM | POA: Diagnosis not present

## 2017-04-09 DIAGNOSIS — J3089 Other allergic rhinitis: Secondary | ICD-10-CM | POA: Diagnosis not present

## 2017-04-11 DIAGNOSIS — J3081 Allergic rhinitis due to animal (cat) (dog) hair and dander: Secondary | ICD-10-CM | POA: Diagnosis not present

## 2017-04-11 DIAGNOSIS — J301 Allergic rhinitis due to pollen: Secondary | ICD-10-CM | POA: Diagnosis not present

## 2017-04-11 DIAGNOSIS — J3089 Other allergic rhinitis: Secondary | ICD-10-CM | POA: Diagnosis not present

## 2017-04-18 DIAGNOSIS — J3081 Allergic rhinitis due to animal (cat) (dog) hair and dander: Secondary | ICD-10-CM | POA: Diagnosis not present

## 2017-04-18 DIAGNOSIS — J301 Allergic rhinitis due to pollen: Secondary | ICD-10-CM | POA: Diagnosis not present

## 2017-04-18 DIAGNOSIS — J3089 Other allergic rhinitis: Secondary | ICD-10-CM | POA: Diagnosis not present

## 2017-04-30 DIAGNOSIS — J3081 Allergic rhinitis due to animal (cat) (dog) hair and dander: Secondary | ICD-10-CM | POA: Diagnosis not present

## 2017-04-30 DIAGNOSIS — J3089 Other allergic rhinitis: Secondary | ICD-10-CM | POA: Diagnosis not present

## 2017-04-30 DIAGNOSIS — J301 Allergic rhinitis due to pollen: Secondary | ICD-10-CM | POA: Diagnosis not present

## 2017-05-02 DIAGNOSIS — J301 Allergic rhinitis due to pollen: Secondary | ICD-10-CM | POA: Diagnosis not present

## 2017-05-09 DIAGNOSIS — J301 Allergic rhinitis due to pollen: Secondary | ICD-10-CM | POA: Diagnosis not present

## 2017-05-09 DIAGNOSIS — J3089 Other allergic rhinitis: Secondary | ICD-10-CM | POA: Diagnosis not present

## 2017-05-09 DIAGNOSIS — J3081 Allergic rhinitis due to animal (cat) (dog) hair and dander: Secondary | ICD-10-CM | POA: Diagnosis not present

## 2017-05-16 DIAGNOSIS — J301 Allergic rhinitis due to pollen: Secondary | ICD-10-CM | POA: Diagnosis not present

## 2017-05-16 DIAGNOSIS — J3081 Allergic rhinitis due to animal (cat) (dog) hair and dander: Secondary | ICD-10-CM | POA: Diagnosis not present

## 2017-05-16 DIAGNOSIS — J3089 Other allergic rhinitis: Secondary | ICD-10-CM | POA: Diagnosis not present

## 2017-05-17 DIAGNOSIS — J3089 Other allergic rhinitis: Secondary | ICD-10-CM | POA: Diagnosis not present

## 2017-05-17 DIAGNOSIS — J3081 Allergic rhinitis due to animal (cat) (dog) hair and dander: Secondary | ICD-10-CM | POA: Diagnosis not present

## 2017-05-23 DIAGNOSIS — J301 Allergic rhinitis due to pollen: Secondary | ICD-10-CM | POA: Diagnosis not present

## 2017-05-23 DIAGNOSIS — J3081 Allergic rhinitis due to animal (cat) (dog) hair and dander: Secondary | ICD-10-CM | POA: Diagnosis not present

## 2017-05-23 DIAGNOSIS — J3089 Other allergic rhinitis: Secondary | ICD-10-CM | POA: Diagnosis not present

## 2017-05-28 DIAGNOSIS — J301 Allergic rhinitis due to pollen: Secondary | ICD-10-CM | POA: Diagnosis not present

## 2017-05-28 DIAGNOSIS — J3089 Other allergic rhinitis: Secondary | ICD-10-CM | POA: Diagnosis not present

## 2017-05-28 DIAGNOSIS — J3081 Allergic rhinitis due to animal (cat) (dog) hair and dander: Secondary | ICD-10-CM | POA: Diagnosis not present

## 2017-06-06 DIAGNOSIS — J3089 Other allergic rhinitis: Secondary | ICD-10-CM | POA: Diagnosis not present

## 2017-06-06 DIAGNOSIS — J301 Allergic rhinitis due to pollen: Secondary | ICD-10-CM | POA: Diagnosis not present

## 2017-06-06 DIAGNOSIS — J3081 Allergic rhinitis due to animal (cat) (dog) hair and dander: Secondary | ICD-10-CM | POA: Diagnosis not present

## 2017-06-13 DIAGNOSIS — J3089 Other allergic rhinitis: Secondary | ICD-10-CM | POA: Diagnosis not present

## 2017-06-13 DIAGNOSIS — J301 Allergic rhinitis due to pollen: Secondary | ICD-10-CM | POA: Diagnosis not present

## 2017-06-13 DIAGNOSIS — J3081 Allergic rhinitis due to animal (cat) (dog) hair and dander: Secondary | ICD-10-CM | POA: Diagnosis not present

## 2017-06-20 DIAGNOSIS — J3089 Other allergic rhinitis: Secondary | ICD-10-CM | POA: Diagnosis not present

## 2017-06-20 DIAGNOSIS — J3081 Allergic rhinitis due to animal (cat) (dog) hair and dander: Secondary | ICD-10-CM | POA: Diagnosis not present

## 2017-06-20 DIAGNOSIS — J301 Allergic rhinitis due to pollen: Secondary | ICD-10-CM | POA: Diagnosis not present

## 2017-06-25 DIAGNOSIS — J301 Allergic rhinitis due to pollen: Secondary | ICD-10-CM | POA: Diagnosis not present

## 2017-06-25 DIAGNOSIS — J3089 Other allergic rhinitis: Secondary | ICD-10-CM | POA: Diagnosis not present

## 2017-06-25 DIAGNOSIS — J3081 Allergic rhinitis due to animal (cat) (dog) hair and dander: Secondary | ICD-10-CM | POA: Diagnosis not present

## 2017-06-27 DIAGNOSIS — J301 Allergic rhinitis due to pollen: Secondary | ICD-10-CM | POA: Diagnosis not present

## 2017-07-02 DIAGNOSIS — J3089 Other allergic rhinitis: Secondary | ICD-10-CM | POA: Diagnosis not present

## 2017-07-02 DIAGNOSIS — J301 Allergic rhinitis due to pollen: Secondary | ICD-10-CM | POA: Diagnosis not present

## 2017-07-02 DIAGNOSIS — J3081 Allergic rhinitis due to animal (cat) (dog) hair and dander: Secondary | ICD-10-CM | POA: Diagnosis not present

## 2017-07-09 DIAGNOSIS — J3081 Allergic rhinitis due to animal (cat) (dog) hair and dander: Secondary | ICD-10-CM | POA: Diagnosis not present

## 2017-07-09 DIAGNOSIS — J301 Allergic rhinitis due to pollen: Secondary | ICD-10-CM | POA: Diagnosis not present

## 2017-07-09 DIAGNOSIS — J3089 Other allergic rhinitis: Secondary | ICD-10-CM | POA: Diagnosis not present

## 2017-07-16 DIAGNOSIS — J301 Allergic rhinitis due to pollen: Secondary | ICD-10-CM | POA: Diagnosis not present

## 2017-07-16 DIAGNOSIS — J3089 Other allergic rhinitis: Secondary | ICD-10-CM | POA: Diagnosis not present

## 2017-07-16 DIAGNOSIS — J3081 Allergic rhinitis due to animal (cat) (dog) hair and dander: Secondary | ICD-10-CM | POA: Diagnosis not present

## 2017-07-30 DIAGNOSIS — J3089 Other allergic rhinitis: Secondary | ICD-10-CM | POA: Diagnosis not present

## 2017-07-30 DIAGNOSIS — J3081 Allergic rhinitis due to animal (cat) (dog) hair and dander: Secondary | ICD-10-CM | POA: Diagnosis not present

## 2017-07-30 DIAGNOSIS — J301 Allergic rhinitis due to pollen: Secondary | ICD-10-CM | POA: Diagnosis not present

## 2017-08-13 DIAGNOSIS — J301 Allergic rhinitis due to pollen: Secondary | ICD-10-CM | POA: Diagnosis not present

## 2017-08-13 DIAGNOSIS — J3081 Allergic rhinitis due to animal (cat) (dog) hair and dander: Secondary | ICD-10-CM | POA: Diagnosis not present

## 2017-08-13 DIAGNOSIS — J3089 Other allergic rhinitis: Secondary | ICD-10-CM | POA: Diagnosis not present

## 2017-08-27 DIAGNOSIS — J3081 Allergic rhinitis due to animal (cat) (dog) hair and dander: Secondary | ICD-10-CM | POA: Diagnosis not present

## 2017-08-27 DIAGNOSIS — J301 Allergic rhinitis due to pollen: Secondary | ICD-10-CM | POA: Diagnosis not present

## 2017-08-27 DIAGNOSIS — J3089 Other allergic rhinitis: Secondary | ICD-10-CM | POA: Diagnosis not present

## 2017-09-12 DIAGNOSIS — J3081 Allergic rhinitis due to animal (cat) (dog) hair and dander: Secondary | ICD-10-CM | POA: Diagnosis not present

## 2017-09-12 DIAGNOSIS — J3089 Other allergic rhinitis: Secondary | ICD-10-CM | POA: Diagnosis not present

## 2017-09-12 DIAGNOSIS — J301 Allergic rhinitis due to pollen: Secondary | ICD-10-CM | POA: Diagnosis not present

## 2017-09-19 DIAGNOSIS — J3081 Allergic rhinitis due to animal (cat) (dog) hair and dander: Secondary | ICD-10-CM | POA: Diagnosis not present

## 2017-09-19 DIAGNOSIS — J301 Allergic rhinitis due to pollen: Secondary | ICD-10-CM | POA: Diagnosis not present

## 2017-09-19 DIAGNOSIS — J3089 Other allergic rhinitis: Secondary | ICD-10-CM | POA: Diagnosis not present

## 2017-09-26 DIAGNOSIS — J3089 Other allergic rhinitis: Secondary | ICD-10-CM | POA: Diagnosis not present

## 2017-09-26 DIAGNOSIS — J3081 Allergic rhinitis due to animal (cat) (dog) hair and dander: Secondary | ICD-10-CM | POA: Diagnosis not present

## 2017-09-26 DIAGNOSIS — J301 Allergic rhinitis due to pollen: Secondary | ICD-10-CM | POA: Diagnosis not present

## 2017-10-10 DIAGNOSIS — J3081 Allergic rhinitis due to animal (cat) (dog) hair and dander: Secondary | ICD-10-CM | POA: Diagnosis not present

## 2017-10-10 DIAGNOSIS — J3089 Other allergic rhinitis: Secondary | ICD-10-CM | POA: Diagnosis not present

## 2017-10-10 DIAGNOSIS — J301 Allergic rhinitis due to pollen: Secondary | ICD-10-CM | POA: Diagnosis not present

## 2017-10-24 DIAGNOSIS — H1045 Other chronic allergic conjunctivitis: Secondary | ICD-10-CM | POA: Diagnosis not present

## 2017-10-24 DIAGNOSIS — J3081 Allergic rhinitis due to animal (cat) (dog) hair and dander: Secondary | ICD-10-CM | POA: Diagnosis not present

## 2017-10-24 DIAGNOSIS — J3089 Other allergic rhinitis: Secondary | ICD-10-CM | POA: Diagnosis not present

## 2017-10-24 DIAGNOSIS — J301 Allergic rhinitis due to pollen: Secondary | ICD-10-CM | POA: Diagnosis not present

## 2017-10-28 DIAGNOSIS — J301 Allergic rhinitis due to pollen: Secondary | ICD-10-CM | POA: Diagnosis not present

## 2017-11-05 DIAGNOSIS — J3089 Other allergic rhinitis: Secondary | ICD-10-CM | POA: Diagnosis not present

## 2017-11-05 DIAGNOSIS — J3081 Allergic rhinitis due to animal (cat) (dog) hair and dander: Secondary | ICD-10-CM | POA: Diagnosis not present

## 2017-11-05 DIAGNOSIS — J301 Allergic rhinitis due to pollen: Secondary | ICD-10-CM | POA: Diagnosis not present

## 2017-11-14 DIAGNOSIS — J3089 Other allergic rhinitis: Secondary | ICD-10-CM | POA: Diagnosis not present

## 2017-11-14 DIAGNOSIS — J3081 Allergic rhinitis due to animal (cat) (dog) hair and dander: Secondary | ICD-10-CM | POA: Diagnosis not present

## 2017-11-14 DIAGNOSIS — J301 Allergic rhinitis due to pollen: Secondary | ICD-10-CM | POA: Diagnosis not present

## 2017-11-26 DIAGNOSIS — J301 Allergic rhinitis due to pollen: Secondary | ICD-10-CM | POA: Diagnosis not present

## 2017-11-26 DIAGNOSIS — J3089 Other allergic rhinitis: Secondary | ICD-10-CM | POA: Diagnosis not present

## 2017-11-26 DIAGNOSIS — J3081 Allergic rhinitis due to animal (cat) (dog) hair and dander: Secondary | ICD-10-CM | POA: Diagnosis not present

## 2017-11-28 DIAGNOSIS — J301 Allergic rhinitis due to pollen: Secondary | ICD-10-CM | POA: Diagnosis not present

## 2017-11-28 DIAGNOSIS — J3081 Allergic rhinitis due to animal (cat) (dog) hair and dander: Secondary | ICD-10-CM | POA: Diagnosis not present

## 2017-11-28 DIAGNOSIS — J3089 Other allergic rhinitis: Secondary | ICD-10-CM | POA: Diagnosis not present

## 2017-12-12 DIAGNOSIS — J3081 Allergic rhinitis due to animal (cat) (dog) hair and dander: Secondary | ICD-10-CM | POA: Diagnosis not present

## 2017-12-12 DIAGNOSIS — J3089 Other allergic rhinitis: Secondary | ICD-10-CM | POA: Diagnosis not present

## 2017-12-12 DIAGNOSIS — J301 Allergic rhinitis due to pollen: Secondary | ICD-10-CM | POA: Diagnosis not present

## 2017-12-19 DIAGNOSIS — J301 Allergic rhinitis due to pollen: Secondary | ICD-10-CM | POA: Diagnosis not present

## 2017-12-19 DIAGNOSIS — J3089 Other allergic rhinitis: Secondary | ICD-10-CM | POA: Diagnosis not present

## 2017-12-19 DIAGNOSIS — J3081 Allergic rhinitis due to animal (cat) (dog) hair and dander: Secondary | ICD-10-CM | POA: Diagnosis not present

## 2017-12-26 DIAGNOSIS — J3089 Other allergic rhinitis: Secondary | ICD-10-CM | POA: Diagnosis not present

## 2017-12-26 DIAGNOSIS — J3081 Allergic rhinitis due to animal (cat) (dog) hair and dander: Secondary | ICD-10-CM | POA: Diagnosis not present

## 2017-12-26 DIAGNOSIS — J301 Allergic rhinitis due to pollen: Secondary | ICD-10-CM | POA: Diagnosis not present

## 2020-04-14 ENCOUNTER — Ambulatory Visit (INDEPENDENT_AMBULATORY_CARE_PROVIDER_SITE_OTHER): Payer: 59 | Admitting: Dermatology

## 2020-04-14 ENCOUNTER — Other Ambulatory Visit: Payer: Self-pay

## 2020-04-14 ENCOUNTER — Encounter: Payer: Self-pay | Admitting: Dermatology

## 2020-04-14 DIAGNOSIS — D1800 Hemangioma unspecified site: Secondary | ICD-10-CM

## 2020-04-14 DIAGNOSIS — Z1283 Encounter for screening for malignant neoplasm of skin: Secondary | ICD-10-CM | POA: Diagnosis not present

## 2020-04-14 DIAGNOSIS — D225 Melanocytic nevi of trunk: Secondary | ICD-10-CM

## 2020-04-14 DIAGNOSIS — D229 Melanocytic nevi, unspecified: Secondary | ICD-10-CM | POA: Diagnosis not present

## 2020-04-14 DIAGNOSIS — L814 Other melanin hyperpigmentation: Secondary | ICD-10-CM

## 2020-04-14 DIAGNOSIS — L719 Rosacea, unspecified: Secondary | ICD-10-CM

## 2020-04-14 DIAGNOSIS — Z86018 Personal history of other benign neoplasm: Secondary | ICD-10-CM

## 2020-04-14 DIAGNOSIS — L309 Dermatitis, unspecified: Secondary | ICD-10-CM | POA: Diagnosis not present

## 2020-04-14 DIAGNOSIS — D485 Neoplasm of uncertain behavior of skin: Secondary | ICD-10-CM

## 2020-04-14 DIAGNOSIS — L578 Other skin changes due to chronic exposure to nonionizing radiation: Secondary | ICD-10-CM | POA: Diagnosis not present

## 2020-04-14 NOTE — Progress Notes (Signed)
   Follow-Up Visit   Subjective  Vanessa Kim is a 44 y.o. female who presents for the following: Annual Exam. Patient has a history of dysplastic nevi treated in the past. Patient has rosacea and is treating with doxycycline 50mg  daily.  Patient presents for total body skin exam for skin cancer screening and mole check.  She also has some heaviness that occurs on the eyelids and would like to discuss this.  The following portions of the chart were reviewed this encounter and updated as appropriate: Allergies  Meds  Problems  Med Hx  Surg Hx  Fam Hx      Review of Systems: No other skin or systemic complaints.  Objective  Well appearing patient in no apparent distress; mood and affect are within normal limits.  A full examination was performed including scalp, head, eyes, ears, nose, lips, neck, chest, axillae, abdomen, back, buttocks, bilateral upper extremities, bilateral lower extremities, hands, feet, fingers, toes, fingernails, and toenails. All findings within normal limits unless otherwise noted below.  Objective  Upper Eyelids: Clear today.  Objective  Right Side Inferior to Bra: 0.7 x 0.3 cm irregular brown macule  Objective  Face: Mild erythema on cheeks, nose.  Assessment & Plan  Eczema, unspecified type Upper Eyelids  Vs Swelling of upper eyelids  Discussed topical treatment. Will re-examine once flared.  Neoplasm of uncertain behavior of skin Right Side Inferior to Bra  Epidermal / dermal shaving  Lesion length (cm):  0.7 Lesion width (cm):  0.3 Margin per side (cm):  0.2 Total excision diameter (cm):  1.1 Informed consent: discussed and consent obtained   Timeout: patient name, date of birth, surgical site, and procedure verified   Procedure prep:  Patient was prepped and draped in usual sterile fashion Prep type:  Isopropyl alcohol Anesthesia: the lesion was anesthetized in a standard fashion   Anesthetic:  1% lidocaine w/ epinephrine  1-100,000 buffered w/ 8.4% NaHCO3 Instrument used: flexible razor blade   Hemostasis achieved with: pressure, aluminum chloride and electrodesiccation   Outcome: patient tolerated procedure well   Post-procedure details: sterile dressing applied and wound care instructions given   Dressing type: bandage and petrolatum    Specimen 1 - Surgical pathology Differential Diagnosis: Nevus vs Dysplastic Nevus Check Margins: No 0.7 x 0.3 cm irregular brown macule  Rosacea Face  Well-controlled with doxycycline 50mg  1 po QD. Patient will call for refills.  Skin cancer screening performed today.  Actinic Damage - diffuse scaly erythematous macules with underlying dyspigmentation - Recommend daily broad spectrum sunscreen SPF 30+ to sun-exposed areas, reapply every 2 hours as needed.  - Call for new or changing lesions.  Melanocytic Nevi - Tan-brown and/or pink-flesh-colored symmetric macules and papules - Benign appearing on exam today - Observation - Call clinic for new or changing moles - Recommend daily use of broad spectrum spf 30+ sunscreen to sun-exposed areas.   Lentigines - Scattered tan macules - Discussed due to sun exposure - Benign, observe - Call for any changes  Hemangiomas - Red papules - Discussed benign nature - Observe - Call for any changes    Return in about 1 year (around 04/14/2021) for TBSE.   Lindi Adie, CMA, am acting as scribe for Sarina Ser, MD . Documentation: I have reviewed the above documentation for accuracy and completeness, and I agree with the above.  Sarina Ser, MD

## 2020-04-14 NOTE — Patient Instructions (Signed)

## 2020-04-19 ENCOUNTER — Telehealth: Payer: Self-pay

## 2020-04-19 NOTE — Telephone Encounter (Signed)
Patient informed of pathology results appointment scheduled for one year TBSE.

## 2020-04-29 ENCOUNTER — Other Ambulatory Visit: Payer: Self-pay | Admitting: Dermatology

## 2020-05-02 ENCOUNTER — Other Ambulatory Visit: Payer: Self-pay | Admitting: Internal Medicine

## 2020-05-02 ENCOUNTER — Other Ambulatory Visit (HOSPITAL_COMMUNITY)
Admission: RE | Admit: 2020-05-02 | Discharge: 2020-05-02 | Disposition: A | Discharge status: Home / Self Care | Payer: 59 | Source: Ambulatory Visit | Attending: Obstetrics & Gynecology | Admitting: Obstetrics & Gynecology

## 2020-05-02 ENCOUNTER — Ambulatory Visit (INDEPENDENT_AMBULATORY_CARE_PROVIDER_SITE_OTHER): Payer: 59 | Admitting: Obstetrics & Gynecology

## 2020-05-02 ENCOUNTER — Encounter: Payer: Self-pay | Admitting: Obstetrics & Gynecology

## 2020-05-02 ENCOUNTER — Other Ambulatory Visit: Payer: Self-pay

## 2020-05-02 VITALS — BP 128/84 | Ht 66.0 in | Wt 164.0 lb

## 2020-05-02 DIAGNOSIS — Z01419 Encounter for gynecological examination (general) (routine) without abnormal findings: Secondary | ICD-10-CM

## 2020-05-02 DIAGNOSIS — Z1231 Encounter for screening mammogram for malignant neoplasm of breast: Secondary | ICD-10-CM | POA: Diagnosis not present

## 2020-05-02 DIAGNOSIS — Z1272 Encounter for screening for malignant neoplasm of vagina: Secondary | ICD-10-CM | POA: Diagnosis not present

## 2020-05-02 NOTE — Progress Notes (Signed)
HPI:      [name] is a 44 y.o. [phone] who LMP was No LMP recorded., she presents today for her annual examination. The patient has no complaints today. The patient is sexually active. Her last pap: approximate date 2015 and was normal and last mammogram: approximate date 2016 and was normal. The patient does perform self breast exams.  There is notable family history of breast or ovarian cancer in her family. NEW DX BREAST CANCER w SISTER age 3.  The patient has regular exercise: yes.  The patient denies current symptoms of depression.    GYN History: Contraception: status post hysterectomy  PMHx: Past Medical History:  Diagnosis Date  . Dysplastic nevus 05/28/2012   Back, severe. Excision.  Marland Kitchen Dysplastic nevus 06/23/2018   R mid back lateral at side   Past Surgical History:  Procedure Laterality Date  . ABDOMINAL HYSTERECTOMY    . ABLATION    . FOOT SURGERY     Family History  Problem Relation Age of Onset  . Diabetes Mellitus II Mother   . Hypertension Mother   . Diabetes Mellitus II Father   . Skin cancer Father   . Breast cancer Sister    Social History   Tobacco Use  . Smoking status: Never Smoker  . Smokeless tobacco: Never Used  Substance Use Topics  . Alcohol use: Yes    Alcohol/week: 0.0 standard drinks  . Drug use: Never    Current Outpatient Medications:  .  ALPRAZolam (XANAX) 0.25 MG tablet, , Disp: , Rfl: 0 .  amphetamine-dextroamphetamine (ADDERALL) 10 MG tablet, , Disp: , Rfl: 0 .  doxycycline (MONODOX) 50 MG capsule, TAKE 1 CAPSULE BY MOUTH ONCE A DAY EVERY EVENING WITH DINNER, Disp: 30 capsule, Rfl: 2 .  fluticasone (FLONASE) 50 MCG/ACT nasal spray, , Disp: , Rfl: 3 .  levocetirizine (XYZAL) 5 MG tablet, , Disp: , Rfl: 6 .  montelukast (SINGULAIR) 10 MG tablet, , Disp: , Rfl: 6 Allergies: Patient has no known allergies.  Review of Systems  Constitutional: Negative for chills, fever and malaise/fatigue.  HENT: Negative for congestion,  sinus pain and sore throat.   Eyes: Negative for blurred vision and pain.  Respiratory: Negative for cough and wheezing.   Cardiovascular: Negative for chest pain and leg swelling.  Gastrointestinal: Negative for abdominal pain, constipation, diarrhea, heartburn, nausea and vomiting.  Genitourinary: Negative for dysuria, frequency, hematuria and urgency.  Musculoskeletal: Negative for back pain, joint pain, myalgias and neck pain.  Skin: Negative for itching and rash.  Neurological: Negative for dizziness, tremors and weakness.  Endo/Heme/Allergies: Does not bruise/bleed easily.  Psychiatric/Behavioral: Negative for depression. The patient is not nervous/anxious and does not have insomnia.     Objective: BP 128/84   Ht 5\' 6"  (1.676 m)   Wt 164 lb (74.4 kg)   BMI 26.47 kg/m   Filed Weights   05/02/20 0843  Weight: 164 lb (74.4 kg)   Body mass index is 26.47 kg/m. Physical Exam Constitutional:      General: She is not in acute distress.    Appearance: She is well-developed.  Genitourinary:     Pelvic exam was performed with patient supine.     Vagina and rectum normal.     No lesions in the vagina.     No vaginal bleeding.     No right or left adnexal mass present.     Right adnexa not tender.     Left adnexa not tender.  Genitourinary Comments: Absent Uterus Absent cervix Vaginal cuff well healed  HENT:     Head: Normocephalic and atraumatic. No laceration.     Right Ear: Hearing normal.     Left Ear: Hearing normal.     Mouth/Throat:     Pharynx: Uvula midline.  Eyes:     Pupils: Pupils are equal, round, and reactive to light.  Neck:     Thyroid: No thyromegaly.  Cardiovascular:     Rate and Rhythm: Normal rate and regular rhythm.     Heart sounds: No murmur. No friction rub. No gallop.   Pulmonary:     Effort: Pulmonary effort is normal. No respiratory distress.     Breath sounds: Normal breath sounds. No wheezing.  Chest:     Breasts:        Right: No  mass, skin change or tenderness.        Left: No mass, skin change or tenderness.  Abdominal:     General: Bowel sounds are normal. There is no distension.     Palpations: Abdomen is soft.     Tenderness: There is no abdominal tenderness. There is no rebound.  Musculoskeletal:        General: Normal range of motion.     Cervical back: Normal range of motion and neck supple.  Neurological:     Mental Status: She is alert and oriented to person, place, and time.     Cranial Nerves: No cranial nerve deficit.  Skin:    General: Skin is warm and dry.  Psychiatric:        Judgment: Judgment normal.  Vitals reviewed.     Assessment:  ANNUAL EXAM 1. Women's annual routine gynecological examination   2. Screening for vaginal cancer   3. Encounter for screening mammogram for malignant neoplasm of breast      Screening Plan:            1.  Vaginal Screening-  Pap smear done today  2. Breast screening- Exam annually and mammogram>40 planned   3. Labs managed by PCP  4. Counseling for contraception: none needed     F/U  Return in about 1 year (around 05/02/2021) for Annual.  Barnett Applebaum, MD, [facility], [facility] 05/02/2020  9:11 AM

## 2020-05-02 NOTE — Patient Instructions (Signed)
PAP every 5 years Mammogram every year    Call [phone] to schedule at [facility] yearly (with PCP)

## 2020-05-03 ENCOUNTER — Ambulatory Visit
Admission: RE | Admit: 2020-05-03 | Discharge: 2020-05-03 | Disposition: A | Discharge status: Home / Self Care | Payer: 59 | Source: Ambulatory Visit | Attending: Obstetrics & Gynecology | Admitting: Obstetrics & Gynecology

## 2020-05-03 ENCOUNTER — Encounter: Payer: Self-pay | Admitting: Radiology

## 2020-05-03 DIAGNOSIS — Z1231 Encounter for screening mammogram for malignant neoplasm of breast: Secondary | ICD-10-CM | POA: Insufficient documentation

## 2020-05-03 LAB — CYTOLOGY - PAP: Diagnosis: NEGATIVE

## 2020-05-18 ENCOUNTER — Other Ambulatory Visit: Payer: Self-pay | Admitting: Obstetrics & Gynecology

## 2020-05-18 DIAGNOSIS — R928 Other abnormal and inconclusive findings on diagnostic imaging of breast: Secondary | ICD-10-CM

## 2020-05-18 DIAGNOSIS — N6489 Other specified disorders of breast: Secondary | ICD-10-CM

## 2020-05-18 DIAGNOSIS — N6310 Unspecified lump in the right breast, unspecified quadrant: Secondary | ICD-10-CM

## 2020-05-25 ENCOUNTER — Ambulatory Visit
Admission: RE | Admit: 2020-05-25 | Discharge: 2020-05-25 | Disposition: A | Discharge status: Home / Self Care | Payer: 59 | Source: Ambulatory Visit | Attending: Obstetrics & Gynecology | Admitting: Obstetrics & Gynecology

## 2020-05-25 ENCOUNTER — Other Ambulatory Visit: Payer: Self-pay | Admitting: Obstetrics & Gynecology

## 2020-05-25 DIAGNOSIS — R928 Other abnormal and inconclusive findings on diagnostic imaging of breast: Secondary | ICD-10-CM | POA: Insufficient documentation

## 2020-05-25 DIAGNOSIS — N6489 Other specified disorders of breast: Secondary | ICD-10-CM | POA: Diagnosis not present

## 2020-05-25 DIAGNOSIS — N6310 Unspecified lump in the right breast, unspecified quadrant: Secondary | ICD-10-CM

## 2020-05-26 NOTE — Progress Notes (Signed)
Results d/w pt Plans SCNB B Breasts Reassured, questions answered  Barnett Applebaum, MD, [facility], [facility] 05/26/2020  7:51 AM

## 2020-06-02 ENCOUNTER — Ambulatory Visit
Admission: RE | Admit: 2020-06-02 | Discharge: 2020-06-02 | Disposition: A | Discharge status: Home / Self Care | Payer: 59 | Source: Ambulatory Visit | Attending: Obstetrics & Gynecology | Admitting: Obstetrics & Gynecology

## 2020-06-02 ENCOUNTER — Other Ambulatory Visit: Payer: Self-pay | Admitting: Internal Medicine

## 2020-06-02 DIAGNOSIS — F988 Other specified behavioral and emotional disorders with onset usually occurring in childhood and adolescence: Secondary | ICD-10-CM | POA: Diagnosis not present

## 2020-06-02 DIAGNOSIS — Z131 Encounter for screening for diabetes mellitus: Secondary | ICD-10-CM | POA: Diagnosis not present

## 2020-06-02 DIAGNOSIS — N6489 Other specified disorders of breast: Secondary | ICD-10-CM | POA: Diagnosis not present

## 2020-06-02 DIAGNOSIS — Z1239 Encounter for other screening for malignant neoplasm of breast: Secondary | ICD-10-CM | POA: Diagnosis not present

## 2020-06-02 DIAGNOSIS — R928 Other abnormal and inconclusive findings on diagnostic imaging of breast: Secondary | ICD-10-CM

## 2020-06-02 DIAGNOSIS — Z0000 Encounter for general adult medical examination without abnormal findings: Secondary | ICD-10-CM | POA: Diagnosis not present

## 2020-06-02 DIAGNOSIS — Z13220 Encounter for screening for lipoid disorders: Secondary | ICD-10-CM | POA: Diagnosis not present

## 2020-06-02 DIAGNOSIS — F411 Generalized anxiety disorder: Secondary | ICD-10-CM | POA: Diagnosis not present

## 2020-06-02 HISTORY — PX: BREAST BIOPSY: SHX20

## 2020-06-03 LAB — SURGICAL PATHOLOGY

## 2020-08-16 ENCOUNTER — Other Ambulatory Visit: Payer: Self-pay | Admitting: Dermatology

## 2020-09-27 IMAGING — MG MM BREAST BX W/ LOC DEV 1ST LESION IMAGE BX SPEC STEREO GUIDE*R*
7 of 11 series · 7 of 23 positions shown · non-contrast
Comparison: Previous exams.
COMPARISON: Previous exams.

Addendum:
CLINICAL DATA: Patient presents for stereotactic guided core biopsy
of asymmetry in both breasts.

EXAM:
RIGHT BREAST STEREOTACTIC CORE NEEDLE BIOPSY
LEFT BREAST STEREOTACTIC CORE NEEDLE BIOPSY

[R (1 of 6)]
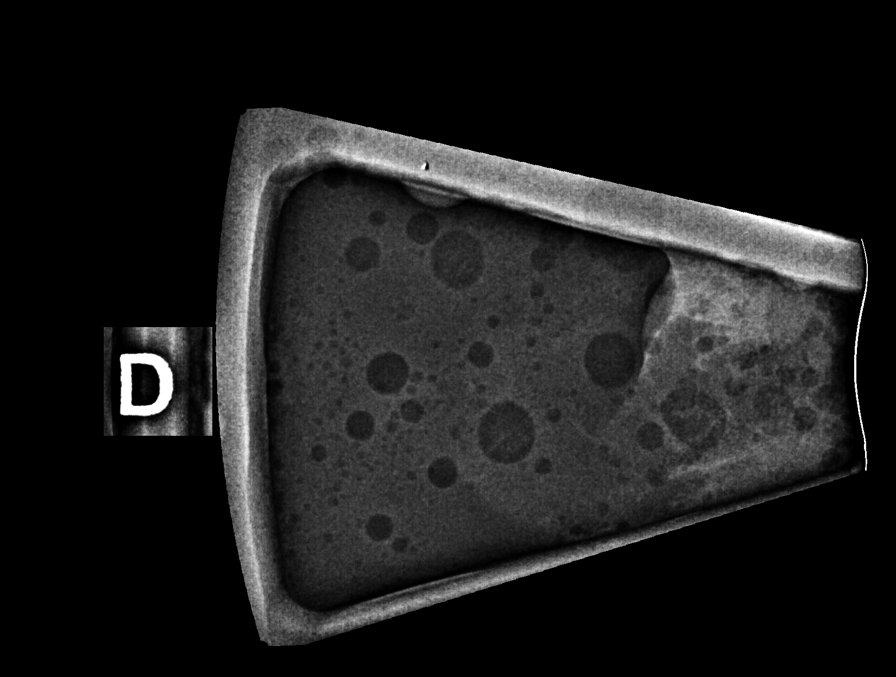

[R (2 of 6)]
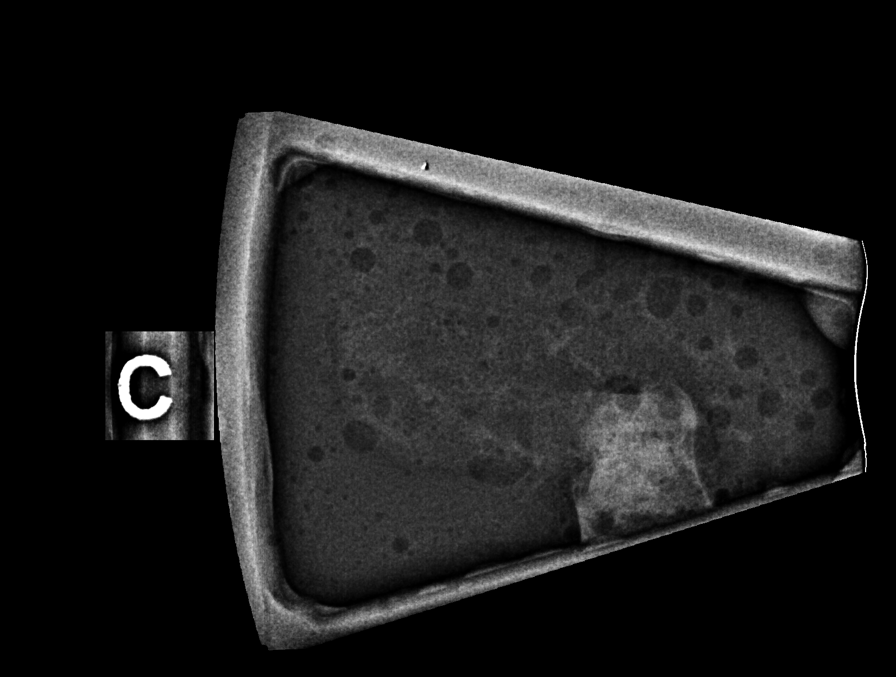

[R (3 of 6)]
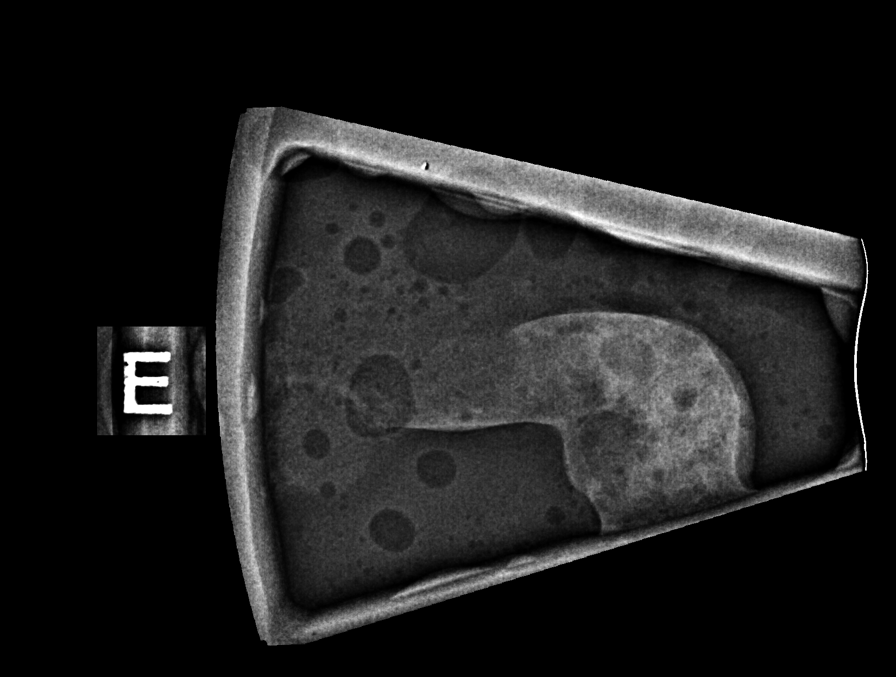

[R (4 of 6)]
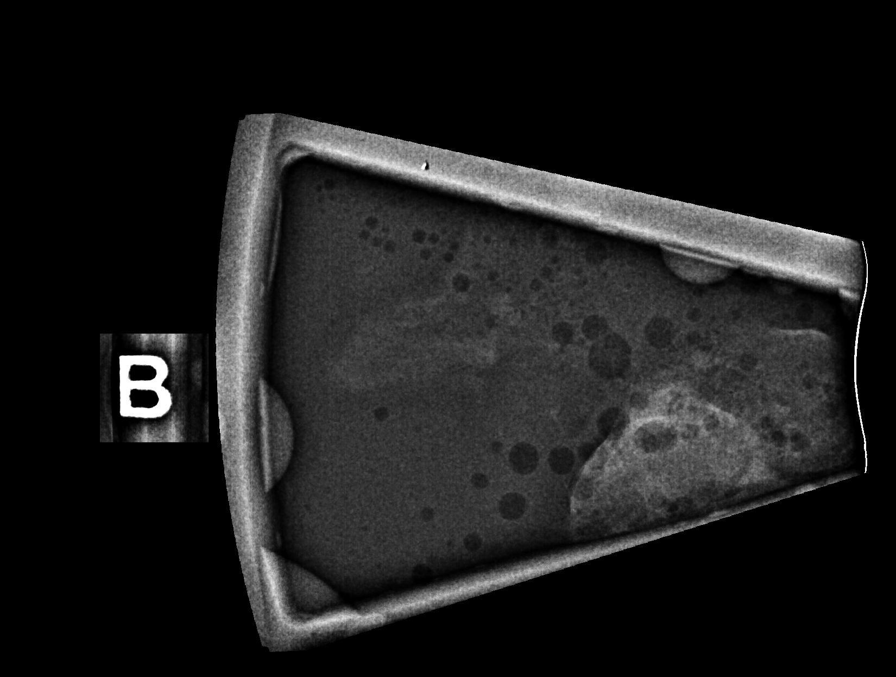

[R (5 of 6)]
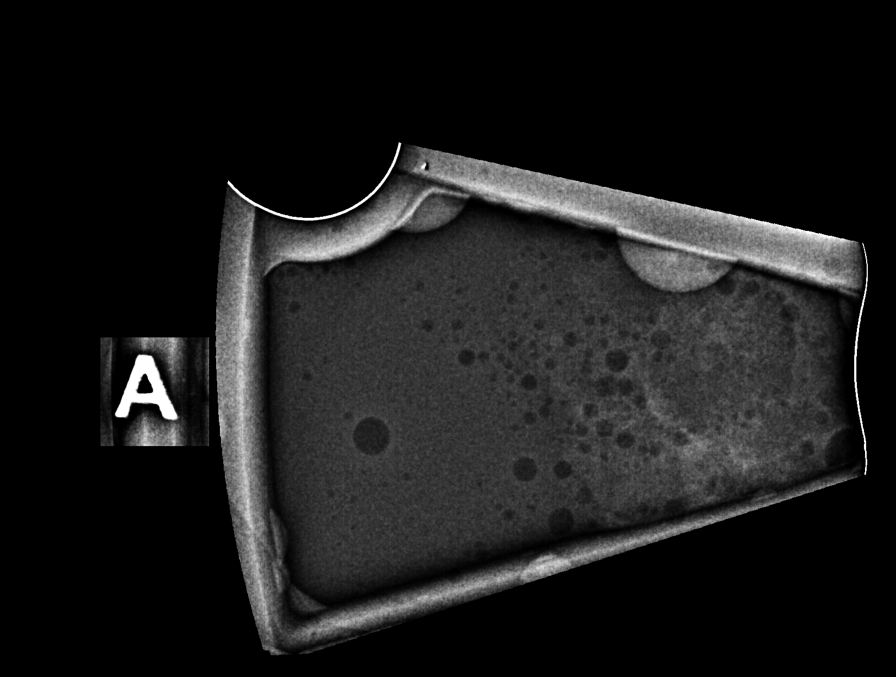

[R (6 of 6)]
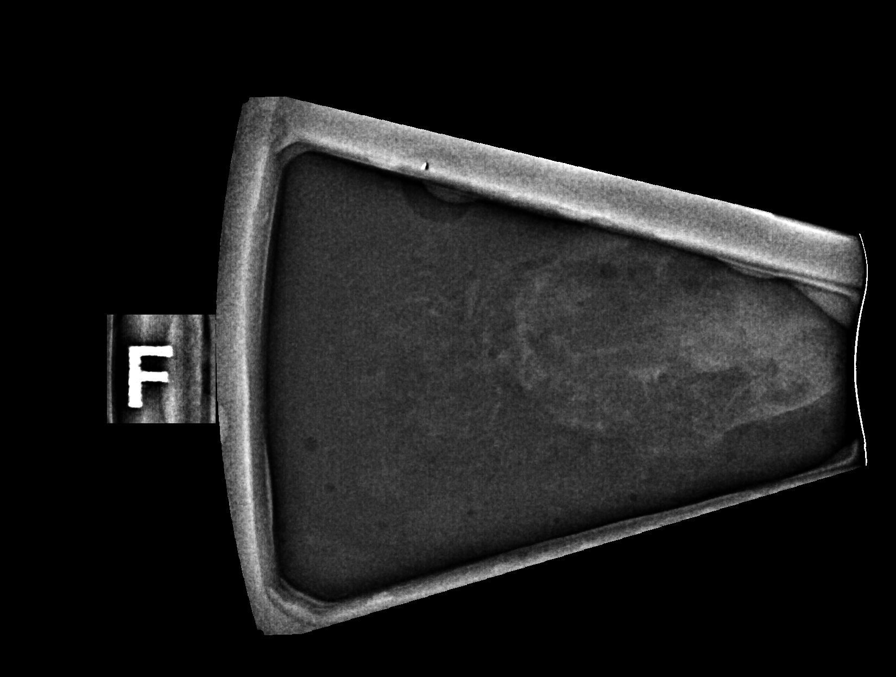

[R CC]
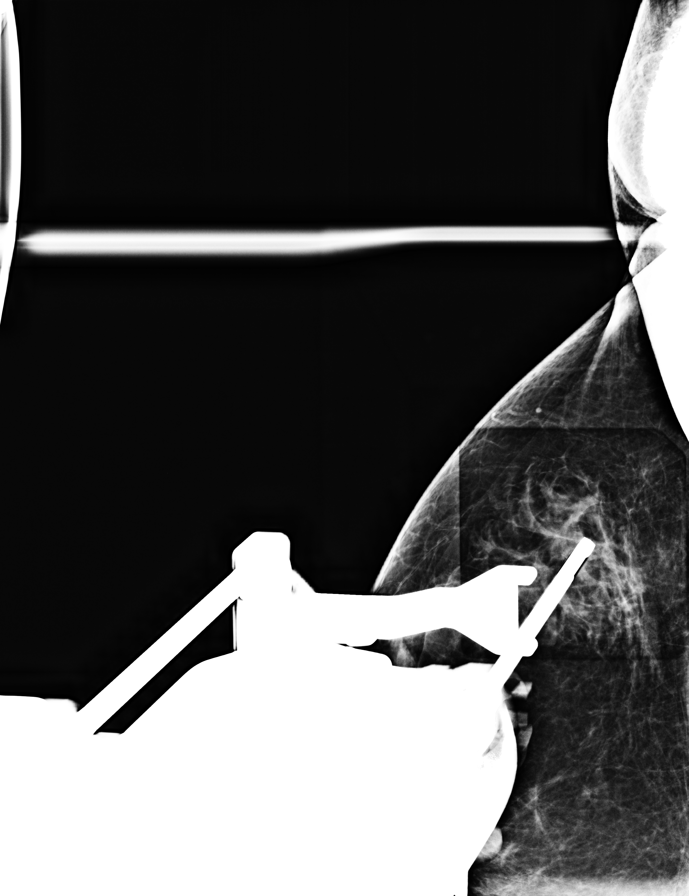

[7 of 23 positions shown; findings below may reference images not displayed]



Site 1: UPPER-OUTER QUADRANT RIGHT breast asymmetry.  X shaped clip

Using sterile technique and 1% Lidocaine and 1% lidocaine with
epinephrine as local anesthetic, under stereotactic guidance, a 9
gauge vacuum assisted device was used to perform core needle biopsy
of asymmetry in the UPPER-OUTER QUADRANT of the RIGHT breast using a
craniocaudal approach.

At the conclusion of the procedure, an X shaped tissue marker clip
was deployed into the biopsy cavity.

Site 2: Posterior central LEFT breast asymmetry.  Coil shaped clip

Using sterile technique and 1% Lidocaine and 1% lidocaine with
epinephrine as local anesthetic, under stereotactic guidance, a 9
gauge vacuum assisted device was used to perform core needle biopsy
of asymmetry in the posterior central LEFT breast using a MEDIAL to
LATERAL approach.

At the conclusion of the procedure, coil shaped tissue marker clip
was deployed into the biopsy cavity.

Follow-up 2-view mammogram was performed and dictated separately.
IMPRESSION: Stereotactic-guided biopsy of RIGHT breast asymmetry and LEFT breast
asymmetry. No apparent complications.

ADDENDUM:
PATHOLOGY revealed:

A. BREAST, RIGHT, UPPER OUTER QUADRANT; STEREOTACTIC BIOPSY: -
BENIGN BREAST PARENCHYMA WITH PSEUDOANGIOMATOUS STROMAL HYPERPLASIA
(PASH). - NEGATIVE FOR ATYPIA AND MALIGNANCY.

B. BREAST, LEFT, POSTERIOR CENTRAL; STEREOTACTIC BIOPSIES: - BENIGN
FIBROFATTY BREAST PARENCHYMA WITH FOCAL HEMORRHAGE AND FIBROSIS. -
NEGATIVE FOR ATYPIA AND MALIGNANCY.

Pathology results are CONCORDANT with imaging findings, per Dr.
Gleviczky Matics.

Pathology results and recommendations below were discussed with
patient by telephone on 06/03/2020. Patient reported biopsy site doing
well with slight tenderness at the site. Post biopsy care
instructions were reviewed and questions were answered. Patient was
instructed to call [HOSPITAL] if any concerns or
questions arise related to the biopsy.

Recommendation: Patient instructed to return in six months for
unilateral LEFT breast diagnostic mammogram and possible ultrasound
to follow-up on fibrosis. Patient informed a reminder notice will be
sent regarding this appointment.

Addendum by Xiomy Swearingen RN on 06/03/2020.



Site 1: UPPER-OUTER QUADRANT RIGHT breast asymmetry.  X shaped clip

Using sterile technique and 1% Lidocaine and 1% lidocaine with
epinephrine as local anesthetic, under stereotactic guidance, a 9
gauge vacuum assisted device was used to perform core needle biopsy
of asymmetry in the UPPER-OUTER QUADRANT of the RIGHT breast using a
craniocaudal approach.

At the conclusion of the procedure, an X shaped tissue marker clip
was deployed into the biopsy cavity.

Site 2: Posterior central LEFT breast asymmetry.  Coil shaped clip

Using sterile technique and 1% Lidocaine and 1% lidocaine with
epinephrine as local anesthetic, under stereotactic guidance, a 9
gauge vacuum assisted device was used to perform core needle biopsy
of asymmetry in the posterior central LEFT breast using a MEDIAL to
LATERAL approach.

At the conclusion of the procedure, coil shaped tissue marker clip
was deployed into the biopsy cavity.

Follow-up 2-view mammogram was performed and dictated separately.
IMPRESSION: Stereotactic-guided biopsy of RIGHT breast asymmetry and LEFT breast
asymmetry. No apparent complications.

## 2020-09-27 IMAGING — MG MM BREAST BX W LOC DEV 1ST LESION IMAGE BX SPEC STEREO GUIDE*L*
7 of 11 series · 7 of 23 positions shown · non-contrast
Comparison: Previous exams.
COMPARISON: Previous exams.

Addendum:
CLINICAL DATA: Patient presents for stereotactic guided core biopsy
of asymmetry in both breasts.

EXAM:
RIGHT BREAST STEREOTACTIC CORE NEEDLE BIOPSY
LEFT BREAST STEREOTACTIC CORE NEEDLE BIOPSY

[L (1 of 6)]
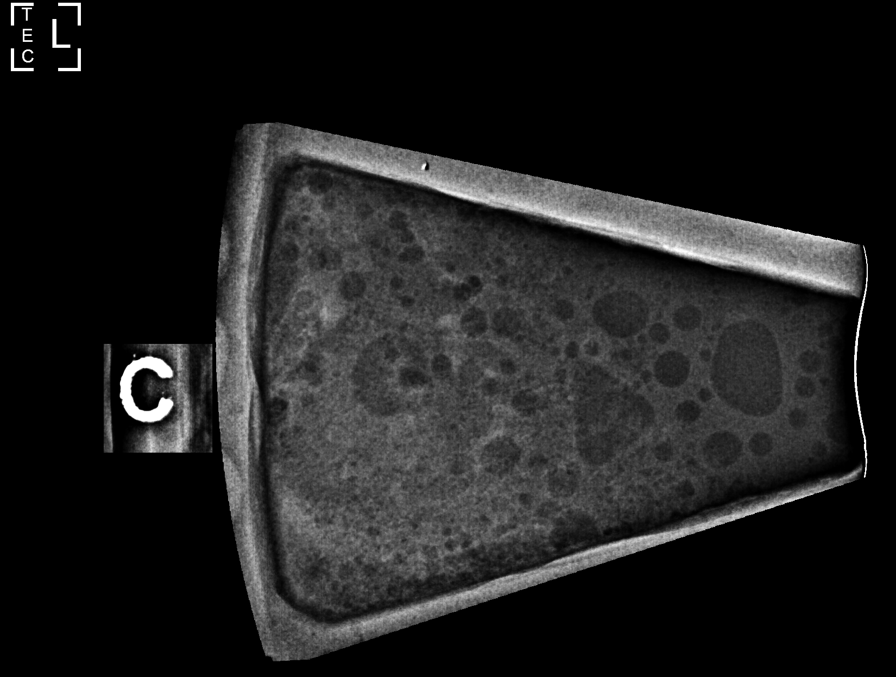

[L (2 of 6)]
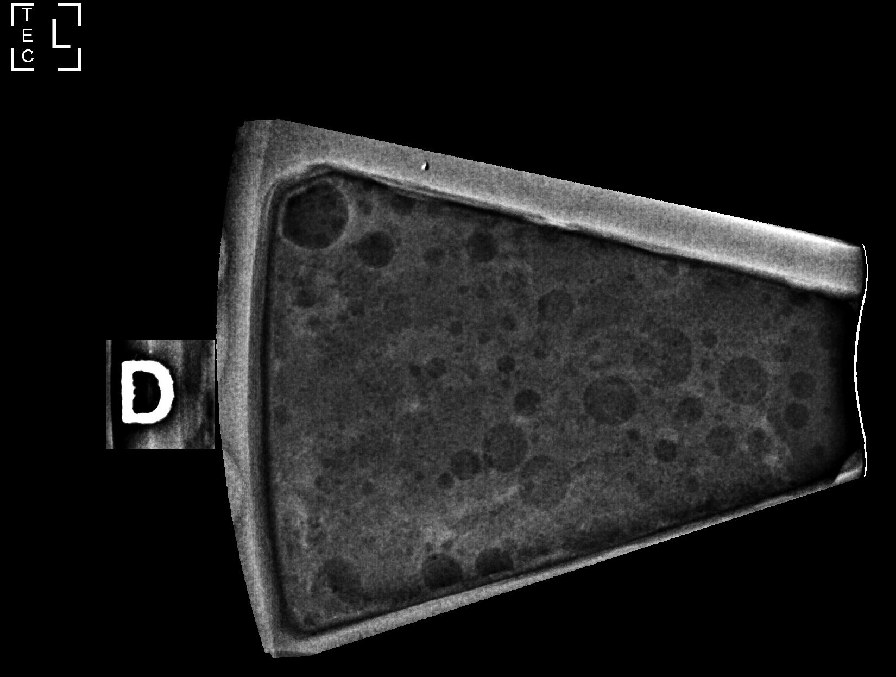

[L (3 of 6)]
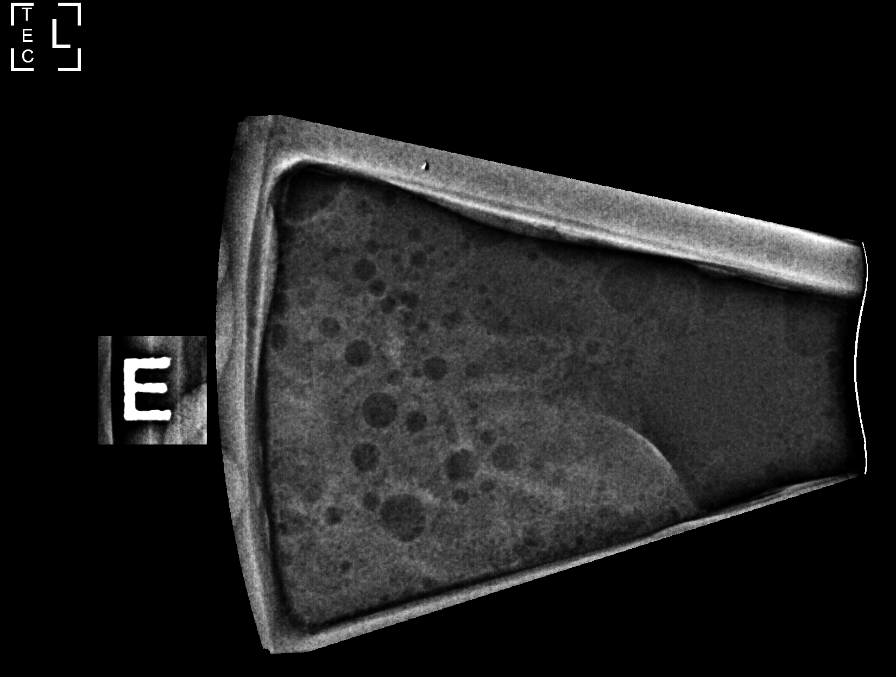

[L (4 of 6)]
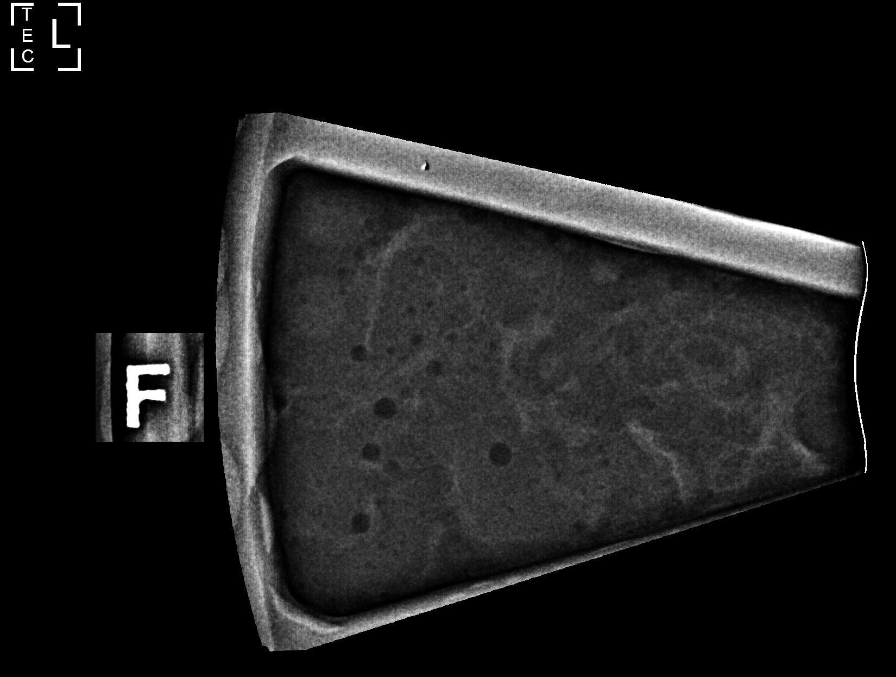

[L (5 of 6)]
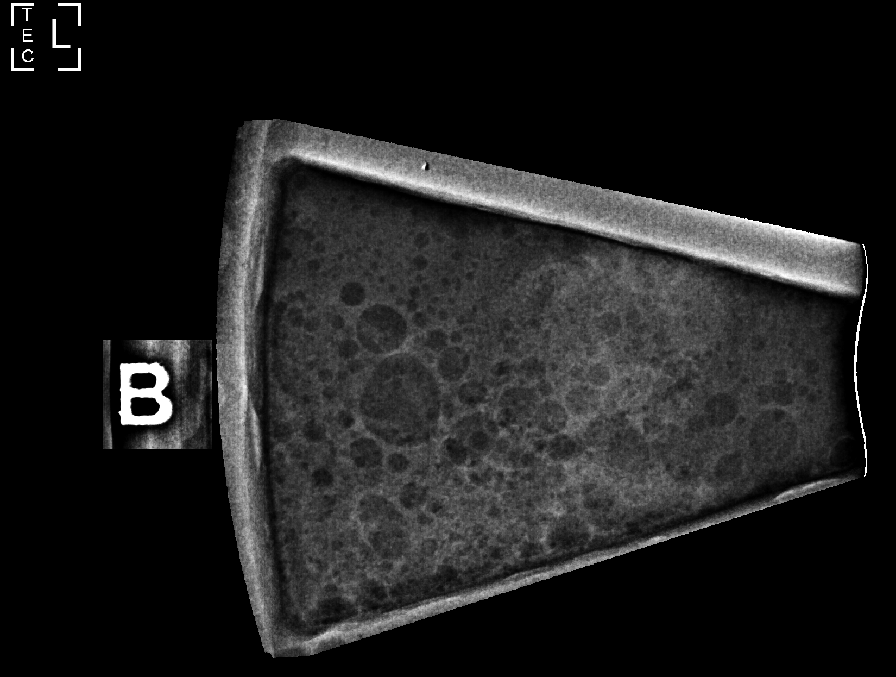

[L (6 of 6)]
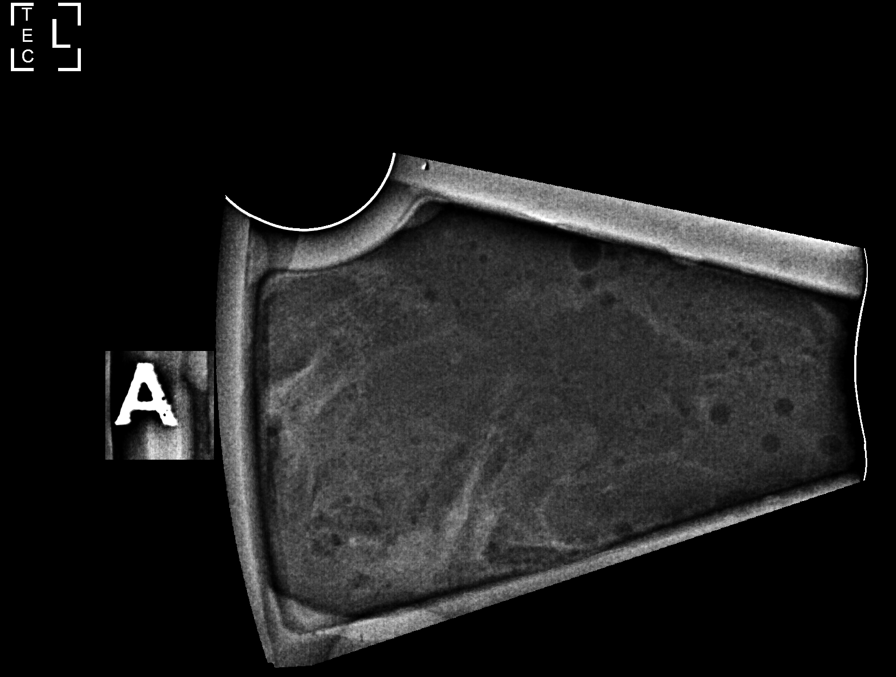

[L ML]
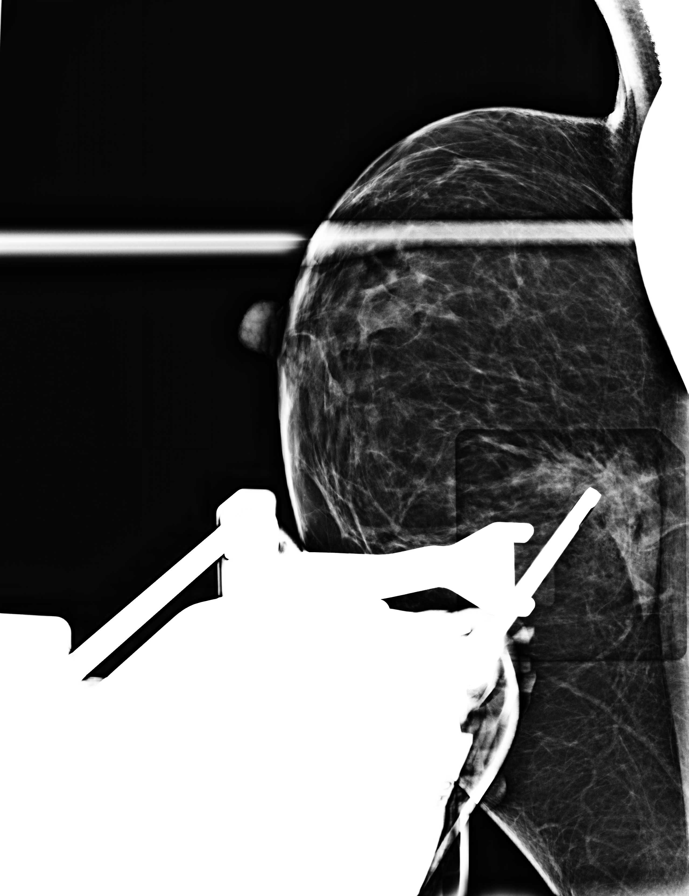

[7 of 23 positions shown; findings below may reference images not displayed]



Site 1: UPPER-OUTER QUADRANT RIGHT breast asymmetry.  X shaped clip

Using sterile technique and 1% Lidocaine and 1% lidocaine with
epinephrine as local anesthetic, under stereotactic guidance, a 9
gauge vacuum assisted device was used to perform core needle biopsy
of asymmetry in the UPPER-OUTER QUADRANT of the RIGHT breast using a
craniocaudal approach.

At the conclusion of the procedure, an X shaped tissue marker clip
was deployed into the biopsy cavity.

Site 2: Posterior central LEFT breast asymmetry.  Coil shaped clip

Using sterile technique and 1% Lidocaine and 1% lidocaine with
epinephrine as local anesthetic, under stereotactic guidance, a 9
gauge vacuum assisted device was used to perform core needle biopsy
of asymmetry in the posterior central LEFT breast using a MEDIAL to
LATERAL approach.

At the conclusion of the procedure, coil shaped tissue marker clip
was deployed into the biopsy cavity.

Follow-up 2-view mammogram was performed and dictated separately.
IMPRESSION: Stereotactic-guided biopsy of RIGHT breast asymmetry and LEFT breast
asymmetry. No apparent complications.

ADDENDUM:
PATHOLOGY revealed:

A. BREAST, RIGHT, UPPER OUTER QUADRANT; STEREOTACTIC BIOPSY: -
BENIGN BREAST PARENCHYMA WITH PSEUDOANGIOMATOUS STROMAL HYPERPLASIA
(PASH). - NEGATIVE FOR ATYPIA AND MALIGNANCY.

B. BREAST, LEFT, POSTERIOR CENTRAL; STEREOTACTIC BIOPSIES: - BENIGN
FIBROFATTY BREAST PARENCHYMA WITH FOCAL HEMORRHAGE AND FIBROSIS. -
NEGATIVE FOR ATYPIA AND MALIGNANCY.

Pathology results are CONCORDANT with imaging findings, per Dr.
Gleviczky Matics.

Pathology results and recommendations below were discussed with
patient by telephone on 06/03/2020. Patient reported biopsy site doing
well with slight tenderness at the site. Post biopsy care
instructions were reviewed and questions were answered. Patient was
instructed to call [HOSPITAL] if any concerns or
questions arise related to the biopsy.

Recommendation: Patient instructed to return in six months for
unilateral LEFT breast diagnostic mammogram and possible ultrasound
to follow-up on fibrosis. Patient informed a reminder notice will be
sent regarding this appointment.

Addendum by Xiomy Swearingen RN on 06/03/2020.



Site 1: UPPER-OUTER QUADRANT RIGHT breast asymmetry.  X shaped clip

Using sterile technique and 1% Lidocaine and 1% lidocaine with
epinephrine as local anesthetic, under stereotactic guidance, a 9
gauge vacuum assisted device was used to perform core needle biopsy
of asymmetry in the UPPER-OUTER QUADRANT of the RIGHT breast using a
craniocaudal approach.

At the conclusion of the procedure, an X shaped tissue marker clip
was deployed into the biopsy cavity.

Site 2: Posterior central LEFT breast asymmetry.  Coil shaped clip

Using sterile technique and 1% Lidocaine and 1% lidocaine with
epinephrine as local anesthetic, under stereotactic guidance, a 9
gauge vacuum assisted device was used to perform core needle biopsy
of asymmetry in the posterior central LEFT breast using a MEDIAL to
LATERAL approach.

At the conclusion of the procedure, coil shaped tissue marker clip
was deployed into the biopsy cavity.

Follow-up 2-view mammogram was performed and dictated separately.
IMPRESSION: Stereotactic-guided biopsy of RIGHT breast asymmetry and LEFT breast
asymmetry. No apparent complications.

## 2020-09-29 DIAGNOSIS — L237 Allergic contact dermatitis due to plants, except food: Secondary | ICD-10-CM | POA: Diagnosis not present

## 2020-10-17 ENCOUNTER — Other Ambulatory Visit: Payer: Self-pay | Admitting: Obstetrics & Gynecology

## 2020-10-17 DIAGNOSIS — R928 Other abnormal and inconclusive findings on diagnostic imaging of breast: Secondary | ICD-10-CM

## 2020-10-18 DIAGNOSIS — Z01818 Encounter for other preprocedural examination: Secondary | ICD-10-CM | POA: Diagnosis not present

## 2020-10-18 DIAGNOSIS — Z1211 Encounter for screening for malignant neoplasm of colon: Secondary | ICD-10-CM | POA: Diagnosis not present

## 2020-10-18 DIAGNOSIS — Z8371 Family history of colonic polyps: Secondary | ICD-10-CM | POA: Diagnosis not present

## 2020-10-21 DIAGNOSIS — Z01818 Encounter for other preprocedural examination: Secondary | ICD-10-CM | POA: Diagnosis not present

## 2020-10-23 ENCOUNTER — Other Ambulatory Visit: Payer: Self-pay | Admitting: Internal Medicine

## 2020-10-26 DIAGNOSIS — K5730 Diverticulosis of large intestine without perforation or abscess without bleeding: Secondary | ICD-10-CM | POA: Diagnosis not present

## 2020-10-26 DIAGNOSIS — Z1211 Encounter for screening for malignant neoplasm of colon: Secondary | ICD-10-CM | POA: Diagnosis not present

## 2020-10-26 DIAGNOSIS — Z8371 Family history of colonic polyps: Secondary | ICD-10-CM | POA: Diagnosis not present

## 2020-10-26 DIAGNOSIS — K640 First degree hemorrhoids: Secondary | ICD-10-CM | POA: Diagnosis not present

## 2020-11-30 ENCOUNTER — Other Ambulatory Visit: Payer: Self-pay | Admitting: Dermatology

## 2020-12-06 ENCOUNTER — Other Ambulatory Visit: Payer: Self-pay

## 2020-12-06 ENCOUNTER — Ambulatory Visit
Admission: RE | Admit: 2020-12-06 | Discharge: 2020-12-06 | Disposition: A | Discharge status: Home / Self Care | Payer: 59 | Source: Ambulatory Visit | Attending: Obstetrics & Gynecology | Admitting: Obstetrics & Gynecology

## 2020-12-06 DIAGNOSIS — R928 Other abnormal and inconclusive findings on diagnostic imaging of breast: Secondary | ICD-10-CM

## 2021-01-10 ENCOUNTER — Other Ambulatory Visit: Payer: Self-pay | Admitting: Internal Medicine

## 2021-02-23 DIAGNOSIS — H524 Presbyopia: Secondary | ICD-10-CM | POA: Diagnosis not present

## 2021-03-10 DIAGNOSIS — G4733 Obstructive sleep apnea (adult) (pediatric): Secondary | ICD-10-CM | POA: Diagnosis not present

## 2021-03-15 ENCOUNTER — Other Ambulatory Visit: Payer: Self-pay | Admitting: Internal Medicine

## 2021-04-03 ENCOUNTER — Other Ambulatory Visit: Payer: Self-pay

## 2021-04-03 MED FILL — Diclofenac Sodium Gel 1% (1.16% Diethylamine Equiv): CUTANEOUS | 30 days supply | Qty: 200 | Fill #0 | Status: AC

## 2021-04-17 ENCOUNTER — Other Ambulatory Visit: Payer: Self-pay

## 2021-04-17 MED FILL — Levocetirizine Dihydrochloride Tab 5 MG: ORAL | 90 days supply | Qty: 90 | Fill #0 | Status: CN

## 2021-04-17 MED FILL — Alprazolam Tab 0.25 MG: ORAL | 15 days supply | Qty: 30 | Fill #0 | Status: AC

## 2021-04-17 MED FILL — Doxycycline Monohydrate Cap 50 MG: ORAL | 30 days supply | Qty: 30 | Fill #0 | Status: AC

## 2021-04-18 ENCOUNTER — Other Ambulatory Visit: Payer: Self-pay

## 2021-04-19 ENCOUNTER — Encounter: Payer: 59 | Admitting: Dermatology

## 2021-04-19 ENCOUNTER — Other Ambulatory Visit: Payer: Self-pay

## 2021-04-19 MED ORDER — MONTELUKAST SODIUM 10 MG PO TABS
1.0000 | ORAL_TABLET | Freq: Every day | ORAL | 3 refills | Status: DC
  Filled 2021-04-19 – 2021-06-27 (×3): qty 90, 90d supply, fill #0
  Filled 2021-09-19 (×2): qty 90, 90d supply, fill #1
  Filled 2021-12-20: qty 90, 90d supply, fill #2
  Filled 2022-03-06: qty 90, 90d supply, fill #3

## 2021-04-19 MED ORDER — AMPHETAMINE-DEXTROAMPHETAMINE 10 MG PO TABS
10.0000 mg | ORAL_TABLET | Freq: Two times a day (BID) | ORAL | 0 refills | Status: DC
  Filled 2021-04-19 – 2021-05-04 (×2): qty 60, 30d supply, fill #0

## 2021-05-01 ENCOUNTER — Other Ambulatory Visit: Payer: Self-pay

## 2021-05-02 ENCOUNTER — Other Ambulatory Visit: Payer: Self-pay

## 2021-05-04 ENCOUNTER — Other Ambulatory Visit: Payer: Self-pay

## 2021-05-12 ENCOUNTER — Telehealth: Payer: 59 | Admitting: Physician Assistant

## 2021-05-12 ENCOUNTER — Other Ambulatory Visit: Payer: Self-pay

## 2021-05-12 DIAGNOSIS — L0100 Impetigo, unspecified: Secondary | ICD-10-CM

## 2021-05-12 DIAGNOSIS — L237 Allergic contact dermatitis due to plants, except food: Secondary | ICD-10-CM

## 2021-05-12 MED ORDER — DOXYCYCLINE HYCLATE 100 MG PO TABS
100.0000 mg | ORAL_TABLET | Freq: Two times a day (BID) | ORAL | 0 refills | Status: DC
  Filled 2021-05-12: qty 20, 10d supply, fill #0

## 2021-05-12 MED ORDER — PREDNISONE 10 MG PO TABS
ORAL_TABLET | ORAL | 0 refills | Status: DC
  Filled 2021-05-12: qty 37, 14d supply, fill #0

## 2021-05-12 NOTE — Progress Notes (Signed)
E-Visit for [employer]  We are sorry that you are not feeing well.  Here is how we plan to help!  Based on what you have shared with me it looks like you have had an allergic reaction to the oily resin from a group of plants.  This resin is very sticky, so it easily attaches to your skin, clothing, tools equipment, and pet's fur.    This blistering rash is often called poison ivy rash although it can come from contact with the leaves, stems and roots of poison ivy, poison oak and poison sumac.  The oily resin contains urushiol (u-ROO-she-ol) that produces a skin rash on exposed skin.  The severity of the rash depends on the amount of urushiol that gets on your skin.  A section of skin with more urushiol on it may develop a rash sooner.  The rash usually develops 12-48 hours after exposure and can last two to three weeks.  Your skin must come in direct contact with the plant's oil to be affected.  Blister fluid doesn't spread the rash.  However, if you come into contact with a piece of clothing or pet fur that has urushiol on it, the rash may spread out.  You can also transfer the oil to other parts of your body with your fingers.  Often the rash looks like a straight line because of the way the plant brushes against your skin.  I suspect that a bacterial infection has developed at the rash site, and I have given you both an oral steroid and an oral antibiotic.  I have sent a prednisone dose pack to your chosen pharmacy. Be sure to follow the instructions carefully and complete the entire prescription.  You may use Benadryl or Caladryl topical lotions to sooth the itch and remember cool, not hot, showers and baths can help relieve the itching!  I have also sent in a prescription for an antibiotic: doxycycline 100 mg twice a day for 10 days.   Please schedule an appointment with your primary care provider to follow up on your skin infection within 2 weeks.  If drainage and red streaking continue to spread  you should be seen urgently.  Make sure that the clothes you were wearing and any towels or sheets that may have come in contact with the oil (urushiol) are washed in detergent and hot water.    I have developed the following plan to treat your condition I am prescribing a two week course of steroids (37 tablets of 10 mg prednisone).  Days 1-4 take 4 tablets (40 mg) daily  Days 5-8 take 3 tablets (30 mg) daily, Days 9-11 take 2 tablets (20 mg) daily, Days 12-14 take 1 tablet (10 mg) daily.  and I am prescribing Doxycycline 100 mg twice per day with food for ten days (20 tablets)   What can you do to prevent this rash?  Avoid the plants.  Learn how to identify poison ivy, poison oak and poison sumac in all seasons.  When hiking or engaging in other activities that might expose you to these plants, try to stay on cleared pathways.  If camping, make sure you pitch your tent in an area free of these plants.  Keep pets from running through wooded areas so that urushiol doesn't accidentally stick to their fur, which you may touch.  Remove or kill the plants.  In your yard, you can get rid of poison ivy by applying an herbicide or pulling it out  of the ground, including the roots, while wearing heavy gloves.  Afterward remove the gloves and thoroughly wash them and your hands.  Don't burn poison ivy or related plants because the urushiol can be carried by smoke.  Wear protective clothing.  If needed, protect your skin by wearing socks, boots, pants, long sleeves and vinyl gloves.  Wash your skin right away.  Washing off the oil with soap and water within 30 minutes of exposure may reduce your chances of getting a poison ivy rash.  Even washing after an hour or so can help reduce the severity of the rash.  If you walk through some poison ivy and then later touch your shoes, you may get some urushiol on your hands, which may then transfer to your face or body by touching or rubbing.  If the contaminated object  isn't cleaned, the urushiol on it can still cause a skin reaction years later.    Be careful not to reuse towels after you have washed your skin.  Also carefully wash clothing in detergent and hot water to remove all traces of the oil.  Handle contaminated clothing carefully so you don't transfer the urushiol to yourself, furniture, rugs or appliances.  Remember that pets can carry the oil on their fur and paws.  If you think your pet may be contaminated with urushiol, put on some long rubber gloves and give your pet a bath.  Finally, be careful not to burn these plants as the smoke can contain traces of the oil.  Inhaling the smoke may result in difficulty breathing. If that occurred you should see a physician as soon as possible.  See your doctor right away if:   The reaction is severe or widespread  You inhaled the smoke from burning poison ivy and are having difficulty breathing  Your skin continues to swell  The rash affects your eyes, mouth or genitals  Blisters are oozing pus  You develop a fever greater than 100 F (37.8 C)  The rash doesn't get better within a few weeks.  If you scratch the poison ivy rash, bacteria under your fingernails may cause the skin to become infected.  See your doctor if pus starts oozing from the blisters.  Treatment generally includes antibiotics.  Poison ivy treatments are usually limited to self-care methods.  And the rash typically goes away on its own in two to three weeks.     If the rash is widespread or results in a large number of blisters, your doctor may prescribe an oral corticosteroid, such as prednisone.  If a bacterial infection has developed at the rash site, your doctor may give you a prescription for an oral antibiotic.  MAKE SURE YOU   Understand these instructions.  Will watch your condition.  Will get help right away if you are not doing well or get worse.  Thank you for choosing an e-visit. Your e-visit answers were  reviewed by a board certified advanced clinical practitioner to complete your personal care plan. Depending upon the condition, your plan could have included both over the counter or prescription medications.  Please review your pharmacy choice. If there is a problem you may use MyChart messaging to have the prescription routed to another pharmacy.   Your safety is important to [place]. If you have drug allergies check your prescription carefully.  You can use MyChart to ask questions about today's visit, request a non-urgent call back, or ask for a work or school excuse for 24  hours related to this e-Visit. If it has been greater than 24 hours you will need to follow up with your provider, or enter a new e-Visit to address those concerns.   You will get an email in the next two days asking about your experience. I hope that your e-visit has been valuable and will speed your recovery Thank you for choosing an e-visit.   I provided 6 minutes of non face-to-face time during this encounter for chart review and documentation.

## 2021-05-31 ENCOUNTER — Other Ambulatory Visit: Payer: Self-pay | Admitting: Dermatology

## 2021-05-31 ENCOUNTER — Other Ambulatory Visit: Payer: Self-pay

## 2021-05-31 MED ORDER — DOXYCYCLINE MONOHYDRATE 50 MG PO CAPS
ORAL_CAPSULE | ORAL | 4 refills | Status: DC
  Filled 2021-05-31: qty 30, 30d supply, fill #0
  Filled 2021-06-27: qty 30, 30d supply, fill #1
  Filled 2021-07-31: qty 30, 30d supply, fill #2
  Filled 2021-08-28: qty 30, 30d supply, fill #3
  Filled 2021-09-30 – 2021-10-02 (×2): qty 30, 30d supply, fill #4

## 2021-06-01 ENCOUNTER — Other Ambulatory Visit: Payer: Self-pay

## 2021-06-02 ENCOUNTER — Other Ambulatory Visit: Payer: Self-pay

## 2021-06-07 ENCOUNTER — Other Ambulatory Visit: Payer: Self-pay

## 2021-06-07 DIAGNOSIS — F988 Other specified behavioral and emotional disorders with onset usually occurring in childhood and adolescence: Secondary | ICD-10-CM | POA: Diagnosis not present

## 2021-06-07 DIAGNOSIS — F411 Generalized anxiety disorder: Secondary | ICD-10-CM | POA: Diagnosis not present

## 2021-06-07 DIAGNOSIS — Z9989 Dependence on other enabling machines and devices: Secondary | ICD-10-CM | POA: Diagnosis not present

## 2021-06-07 DIAGNOSIS — G4733 Obstructive sleep apnea (adult) (pediatric): Secondary | ICD-10-CM | POA: Diagnosis not present

## 2021-06-07 MED ORDER — ALPRAZOLAM 0.25 MG PO TABS
ORAL_TABLET | ORAL | 5 refills | Status: DC
  Filled 2021-06-07: qty 30, 15d supply, fill #0
  Filled 2021-07-19: qty 30, 15d supply, fill #1

## 2021-06-07 MED ORDER — AMPHETAMINE-DEXTROAMPHETAMINE 10 MG PO TABS
10.0000 mg | ORAL_TABLET | Freq: Two times a day (BID) | ORAL | 0 refills | Status: DC
  Filled 2021-06-07: qty 60, 30d supply, fill #0

## 2021-06-09 ENCOUNTER — Other Ambulatory Visit: Payer: Self-pay

## 2021-06-23 DIAGNOSIS — G4733 Obstructive sleep apnea (adult) (pediatric): Secondary | ICD-10-CM | POA: Diagnosis not present

## 2021-06-27 ENCOUNTER — Other Ambulatory Visit: Payer: Self-pay

## 2021-06-27 MED ORDER — LEVOCETIRIZINE DIHYDROCHLORIDE 5 MG PO TABS
ORAL_TABLET | ORAL | 6 refills | Status: AC
  Filled 2021-06-27: qty 90, 90d supply, fill #0
  Filled 2021-09-19 (×2): qty 90, 90d supply, fill #1
  Filled 2021-12-20: qty 90, 90d supply, fill #2
  Filled 2022-03-06: qty 90, 90d supply, fill #3

## 2021-07-18 ENCOUNTER — Other Ambulatory Visit: Payer: Self-pay

## 2021-07-18 MED ORDER — AMPHETAMINE-DEXTROAMPHETAMINE 10 MG PO TABS
10.0000 mg | ORAL_TABLET | Freq: Two times a day (BID) | ORAL | 0 refills | Status: DC
  Filled 2021-07-18: qty 60, 30d supply, fill #0

## 2021-07-19 ENCOUNTER — Other Ambulatory Visit: Payer: Self-pay

## 2021-07-20 ENCOUNTER — Other Ambulatory Visit: Payer: Self-pay

## 2021-07-21 ENCOUNTER — Other Ambulatory Visit: Payer: Self-pay

## 2021-07-21 MED ORDER — DICLOFENAC SODIUM 1 % EX GEL
CUTANEOUS | 11 refills | Status: DC
  Filled 2021-07-21: qty 200, 16d supply, fill #0
  Filled 2022-03-06: qty 200, 16d supply, fill #1

## 2021-07-31 ENCOUNTER — Other Ambulatory Visit: Payer: Self-pay

## 2021-08-18 ENCOUNTER — Other Ambulatory Visit: Payer: Self-pay

## 2021-08-18 MED ORDER — ALPRAZOLAM 0.25 MG PO TABS
ORAL_TABLET | ORAL | 5 refills | Status: DC
  Filled 2021-08-18: qty 30, 15d supply, fill #0
  Filled 2021-09-19 (×2): qty 30, 15d supply, fill #1
  Filled 2021-10-25: qty 30, 15d supply, fill #2
  Filled 2021-11-30: qty 30, 15d supply, fill #3
  Filled 2021-12-20: qty 30, 15d supply, fill #4
  Filled 2022-02-08: qty 30, 15d supply, fill #5

## 2021-08-18 MED ORDER — AMPHETAMINE-DEXTROAMPHETAMINE 10 MG PO TABS
10.0000 mg | ORAL_TABLET | Freq: Two times a day (BID) | ORAL | 0 refills | Status: DC
  Filled 2021-08-18: qty 60, 30d supply, fill #0

## 2021-08-23 ENCOUNTER — Ambulatory Visit (INDEPENDENT_AMBULATORY_CARE_PROVIDER_SITE_OTHER): Payer: 59 | Admitting: Dermatology

## 2021-08-23 ENCOUNTER — Other Ambulatory Visit: Payer: Self-pay

## 2021-08-23 DIAGNOSIS — D229 Melanocytic nevi, unspecified: Secondary | ICD-10-CM

## 2021-08-23 DIAGNOSIS — Z1283 Encounter for screening for malignant neoplasm of skin: Secondary | ICD-10-CM

## 2021-08-23 DIAGNOSIS — D1800 Hemangioma unspecified site: Secondary | ICD-10-CM

## 2021-08-23 DIAGNOSIS — L814 Other melanin hyperpigmentation: Secondary | ICD-10-CM

## 2021-08-23 DIAGNOSIS — L578 Other skin changes due to chronic exposure to nonionizing radiation: Secondary | ICD-10-CM | POA: Diagnosis not present

## 2021-08-23 DIAGNOSIS — L821 Other seborrheic keratosis: Secondary | ICD-10-CM

## 2021-08-23 DIAGNOSIS — Z86018 Personal history of other benign neoplasm: Secondary | ICD-10-CM

## 2021-08-23 NOTE — Patient Instructions (Signed)

## 2021-08-23 NOTE — Progress Notes (Signed)
   Follow-Up Visit   Subjective  Vanessa Kim is a 45 y.o. female who presents for the following: Annual Exam (Hx dysplastic nevi - patient has noticed two lesions on her L leg she would like checked today.). The patient presents for Total-Body Skin Exam (TBSE) for skin cancer screening and mole check.  The following portions of the chart were reviewed this encounter and updated as appropriate:   Tobacco  Allergies  Meds  Problems  Med Hx  Surg Hx  Fam Hx     Review of Systems:  No other skin or systemic complaints except as noted in HPI or Assessment and Plan.  Objective  Well appearing patient in no apparent distress; mood and affect are within normal limits.  A full examination was performed including scalp, head, eyes, ears, nose, lips, neck, chest, axillae, abdomen, back, buttocks, bilateral upper extremities, bilateral lower extremities, hands, feet, fingers, toes, fingernails, and toenails. All findings within normal limits unless otherwise noted below.  numerous see history Clear.   Assessment & Plan  History of dysplastic nevus numerous see history  Clear. Observe for recurrence. Call clinic for new or changing lesions.  Recommend regular skin exams, daily broad-spectrum spf 30+ sunscreen use, and photoprotection.     Skin cancer screening  Lentigines - Scattered tan macules - Due to sun exposure - Benign-appering, observe - Recommend daily broad spectrum sunscreen SPF 30+ to sun-exposed areas, reapply every 2 hours as needed. - Call for any changes  Seborrheic Keratoses - Stuck-on, waxy, tan-brown papules and/or plaques  - Benign-appearing - Discussed benign etiology and prognosis. - Observe - Call for any changes  Melanocytic Nevi - Tan-brown and/or pink-flesh-colored symmetric macules and papules - Benign appearing on exam today - Observation - Call clinic for new or changing moles - Recommend daily use of broad spectrum spf 30+ sunscreen to  sun-exposed areas.   Hemangiomas - Red papules - Discussed benign nature - Observe - Call for any changes  Actinic Damage - Chronic condition, secondary to cumulative UV/sun exposure - diffuse scaly erythematous macules with underlying dyspigmentation - Recommend daily broad spectrum sunscreen SPF 30+ to sun-exposed areas, reapply every 2 hours as needed.  - Staying in the shade or wearing long sleeves, sun glasses (UVA+UVB protection) and wide brim hats (4-inch brim around the entire circumference of the hat) are also recommended for sun protection.  - Call for new or changing lesions.  Skin cancer screening performed today.  Return in about 1 year (around 08/23/2022) for TBSE.  Luther Redo, CMA, am acting as scribe for Sarina Ser, MD . Documentation: I have reviewed the above documentation for accuracy and completeness, and I agree with the above.  Sarina Ser, MD

## 2021-08-24 ENCOUNTER — Encounter: Payer: Self-pay | Admitting: Dermatology

## 2021-08-28 ENCOUNTER — Other Ambulatory Visit: Payer: Self-pay

## 2021-09-19 ENCOUNTER — Other Ambulatory Visit: Payer: Self-pay

## 2021-09-19 MED ORDER — AMPHETAMINE-DEXTROAMPHETAMINE 10 MG PO TABS
10.0000 mg | ORAL_TABLET | Freq: Two times a day (BID) | ORAL | 0 refills | Status: DC
  Filled 2021-09-19: qty 60, 30d supply, fill #0

## 2021-09-25 DIAGNOSIS — G4733 Obstructive sleep apnea (adult) (pediatric): Secondary | ICD-10-CM | POA: Diagnosis not present

## 2021-10-02 ENCOUNTER — Other Ambulatory Visit: Payer: Self-pay

## 2021-10-25 ENCOUNTER — Other Ambulatory Visit: Payer: Self-pay

## 2021-10-26 ENCOUNTER — Other Ambulatory Visit: Payer: Self-pay

## 2021-10-26 MED ORDER — AMPHETAMINE-DEXTROAMPHETAMINE 10 MG PO TABS
10.0000 mg | ORAL_TABLET | Freq: Two times a day (BID) | ORAL | 0 refills | Status: DC
  Filled 2021-10-26: qty 60, 30d supply, fill #0

## 2021-11-06 ENCOUNTER — Other Ambulatory Visit: Payer: Self-pay | Admitting: Dermatology

## 2021-11-06 ENCOUNTER — Other Ambulatory Visit: Payer: Self-pay

## 2021-11-06 MED ORDER — DOXYCYCLINE MONOHYDRATE 50 MG PO CAPS
ORAL_CAPSULE | ORAL | 4 refills | Status: DC
  Filled 2021-11-06: qty 30, 30d supply, fill #0
  Filled 2021-11-30: qty 30, 30d supply, fill #1
  Filled 2022-01-07: qty 30, 30d supply, fill #2
  Filled 2022-02-08: qty 30, 30d supply, fill #3
  Filled 2022-03-06: qty 30, 30d supply, fill #4

## 2021-11-30 ENCOUNTER — Other Ambulatory Visit: Payer: Self-pay

## 2021-11-30 MED ORDER — AMPHETAMINE-DEXTROAMPHETAMINE 10 MG PO TABS
10.0000 mg | ORAL_TABLET | Freq: Two times a day (BID) | ORAL | 0 refills | Status: DC
  Filled 2021-11-30: qty 60, 30d supply, fill #0

## 2021-12-20 ENCOUNTER — Other Ambulatory Visit: Payer: Self-pay

## 2022-01-08 ENCOUNTER — Other Ambulatory Visit: Payer: Self-pay

## 2022-01-09 ENCOUNTER — Other Ambulatory Visit: Payer: Self-pay

## 2022-01-09 MED ORDER — AMPHETAMINE-DEXTROAMPHETAMINE 10 MG PO TABS
10.0000 mg | ORAL_TABLET | Freq: Two times a day (BID) | ORAL | 0 refills | Status: DC
  Filled 2022-01-09: qty 60, 30d supply, fill #0

## 2022-02-08 ENCOUNTER — Other Ambulatory Visit: Payer: Self-pay

## 2022-02-08 MED ORDER — AMPHETAMINE-DEXTROAMPHETAMINE 10 MG PO TABS
10.0000 mg | ORAL_TABLET | Freq: Two times a day (BID) | ORAL | 0 refills | Status: DC
  Filled 2022-02-08: qty 60, 30d supply, fill #0

## 2022-03-06 ENCOUNTER — Other Ambulatory Visit: Payer: Self-pay

## 2022-03-06 MED ORDER — ALPRAZOLAM 0.25 MG PO TABS
ORAL_TABLET | ORAL | 5 refills | Status: DC
  Filled 2022-03-06: qty 30, 15d supply, fill #0
  Filled 2022-04-12: qty 30, 15d supply, fill #1
  Filled 2022-05-18: qty 30, 15d supply, fill #2
  Filled 2022-07-09: qty 30, 15d supply, fill #3
  Filled 2022-08-10: qty 30, 15d supply, fill #4

## 2022-03-06 MED ORDER — AMPHETAMINE-DEXTROAMPHETAMINE 10 MG PO TABS
10.0000 mg | ORAL_TABLET | Freq: Two times a day (BID) | ORAL | 0 refills | Status: DC
  Filled 2022-03-06: qty 60, 30d supply, fill #0

## 2022-04-12 ENCOUNTER — Other Ambulatory Visit: Payer: Self-pay | Admitting: Dermatology

## 2022-04-13 ENCOUNTER — Other Ambulatory Visit: Payer: Self-pay | Admitting: Dermatology

## 2022-04-13 ENCOUNTER — Other Ambulatory Visit: Payer: Self-pay

## 2022-04-16 ENCOUNTER — Other Ambulatory Visit: Payer: Self-pay

## 2022-04-16 MED ORDER — AMPHETAMINE-DEXTROAMPHETAMINE 10 MG PO TABS
10.0000 mg | ORAL_TABLET | Freq: Two times a day (BID) | ORAL | 0 refills | Status: DC
  Filled 2022-04-16: qty 60, 30d supply, fill #0

## 2022-04-16 MED ORDER — DOXYCYCLINE MONOHYDRATE 50 MG PO CAPS
ORAL_CAPSULE | ORAL | 4 refills | Status: DC
  Filled 2022-04-16: qty 30, 30d supply, fill #0
  Filled 2022-05-18: qty 30, 30d supply, fill #1
  Filled 2022-06-14: qty 30, 30d supply, fill #2
  Filled 2022-07-18: qty 30, 30d supply, fill #3
  Filled 2022-08-28: qty 30, 30d supply, fill #4

## 2022-05-18 ENCOUNTER — Other Ambulatory Visit: Payer: Self-pay

## 2022-05-19 ENCOUNTER — Other Ambulatory Visit: Payer: Self-pay

## 2022-05-19 MED ORDER — AMPHETAMINE-DEXTROAMPHETAMINE 10 MG PO TABS
10.0000 mg | ORAL_TABLET | Freq: Two times a day (BID) | ORAL | 0 refills | Status: DC
  Filled 2022-05-19: qty 60, 30d supply, fill #0

## 2022-05-21 ENCOUNTER — Encounter: Payer: Self-pay | Admitting: Obstetrics and Gynecology

## 2022-05-21 ENCOUNTER — Other Ambulatory Visit: Payer: Self-pay

## 2022-05-21 NOTE — Progress Notes (Unsigned)
PCP: Kirk Ruths, MD   No chief complaint on file.   HPI:      [name] is a 46 y.o. [id number] whose LMP was No LMP recorded. Patient has had a hysterectomy., presents today for her annual examination.  Her menses are absent due to lap hyst BS due to CPP in 2015 with Dr. Kenton Kingfisher. She {does:18564} have vasomotor sx.   Sex activity: {sex active: 315163}. She {does:18564} have vaginal dryness.  Last Pap: 05/02/20 Results were: no abnormalities  Hx of STDs: {STD hx:14358}  Last mammogram: 05/25/20 Results were: Cat 4 LT breast with neg breast bx 6/21; normal f/u imaging LT breast 12/21; due for yearly mammo There is no FH of breast cancer. There is no FH of ovarian cancer. The patient {does:18564} do self-breast exams.  Colonoscopy: {hx:15363}  Repeat due after 10*** years.   Tobacco use: {tob:20664} Alcohol use: {Alcohol:11675} No drug use Exercise: {exercise:31265}  She {does:18564} get adequate calcium and Vitamin D in her diet.  Labs with PCP.   There are no problems to display for this patient.   Past Surgical History:  Procedure Laterality Date   ABDOMINAL HYSTERECTOMY     ABLATION     BREAST BIOPSY Right 06/02/2020   affirm bx x clip path pending   BREAST BIOPSY Left 06/02/2020   affirm bx coil clip path pending   FOOT SURGERY      Family History  Problem Relation Age of Onset   Diabetes Mellitus II Mother    Hypertension Mother    Diabetes Mellitus II Father    Skin cancer Father    Breast cancer Sister    Breast cancer Maternal Grandmother     Social History   Socioeconomic History   Marital status: Married    Spouse name: Not on file   Number of children: Not on file   Years of education: Not on file   Highest education level: Not on file  Occupational History   Not on file  Tobacco Use   Smoking status: Never   Smokeless tobacco: Never  Substance and Sexual Activity   Alcohol use: Yes    Alcohol/week: 0.0 standard drinks    Drug use: Never   Sexual activity: Yes    Birth control/protection: Surgical  Other Topics Concern   Not on file  Social History Narrative   Not on file   Social Determinants of Health   Financial Resource Strain: Not on file  Food Insecurity: Not on file  Transportation Needs: Not on file  Physical Activity: Not on file  Stress: Not on file  Social Connections: Not on file  Intimate Partner Violence: Not on file     Current Outpatient Medications:    ALPRAZolam (XANAX) 0.25 MG tablet, , Disp: , Rfl: 0   ALPRAZolam (XANAX) 0.25 MG tablet, Take 1 tablet (0.25 mg total) by mouth 2 (two) times daily as needed for Sleep (Patient not taking: Reported on 08/23/2021), Disp: 30 tablet, Rfl: 5   ALPRAZolam (XANAX) 0.25 MG tablet, Take 1 tablet (0.25 mg total) by mouth 2 (two) times daily as needed for Sleep, Disp: 30 tablet, Rfl: 5   amphetamine-dextroamphetamine (ADDERALL) 10 MG tablet, , Disp: , Rfl: 0   amphetamine-dextroamphetamine (ADDERALL) 10 MG tablet, TAKE 1 TABLET BY MOUTH TWICE DAILY, Disp: 60 tablet, Rfl: 0   amphetamine-dextroamphetamine (ADDERALL) 10 MG tablet, TAKE 1 TABLET BY MOUTH TWO TIMES DAILY, Disp: 60 tablet, Rfl: 0   amphetamine-dextroamphetamine (ADDERALL) 10  MG tablet, TAKE 1 TABLET BY MOUTH TWO TIMES DAILY, Disp: 60 tablet, Rfl: 0   amphetamine-dextroamphetamine (ADDERALL) 10 MG tablet, Take 1 tablet (10 mg total) by mouth 2 (two) times daily, Disp: 60 tablet, Rfl: 0   diclofenac Sodium (VOLTAREN) 1 % GEL, APPLY 4 GRAMS TOPICALLY 3 TIMES DAILY (Patient not taking: Reported on 08/23/2021), Disp: 200 g, Rfl: 11   doxycycline (MONODOX) 50 MG capsule, TAKE 1 CAPSULE BY MOUTH ONCE A DAY EVERY EVENING WITH DINNER, Disp: 30 capsule, Rfl: 4   fluticasone (FLONASE) 50 MCG/ACT nasal spray, , Disp: , Rfl: 3   levocetirizine (XYZAL) 5 MG tablet, , Disp: , Rfl: 6   levocetirizine (XYZAL) 5 MG tablet, TAKE 1 TABLET (5 MG TOTAL) BY MOUTH ONCE DAILY (Patient not taking: Reported on  08/23/2021), Disp: 90 tablet, Rfl: 6   montelukast (SINGULAIR) 10 MG tablet, , Disp: , Rfl: 6   montelukast (SINGULAIR) 10 MG tablet, TAKE 1 TABLET BY MOUTH ONCE DAILY (Patient not taking: Reported on 08/23/2021), Disp: 90 tablet, Rfl: 3   predniSONE (DELTASONE) 10 MG tablet, Days 1-4 take 4 tablets (40 mg) daily  Days 5-8 take 3 tablets (30 mg) daily, Days 9-11 take 2 tablets (20 mg) daily, Days 12-14 take 1 tablet (10 mg) daily (Patient not taking: Reported on 08/23/2021), Disp: 37 tablet, Rfl: 0     ROS:  Review of Systems BREAST: No symptoms    Objective: There were no vitals taken for this visit.   OBGyn Exam  Results: No results found for this or any previous visit (from the past 24 hour(s)).  Assessment/Plan:  No diagnosis found.   No orders of the defined types were placed in this encounter.           GYN counsel {counseling: 16159}    F/U  No follow-ups on file.  Shatoria Stooksbury B. Flornce Record, PA-C 05/21/2022 11:31 AM

## 2022-05-22 ENCOUNTER — Ambulatory Visit (INDEPENDENT_AMBULATORY_CARE_PROVIDER_SITE_OTHER): Payer: 59 | Admitting: Obstetrics and Gynecology

## 2022-05-22 ENCOUNTER — Encounter: Payer: Self-pay | Admitting: Obstetrics and Gynecology

## 2022-05-22 VITALS — BP 150/90 | Ht 66.0 in | Wt 171.0 lb

## 2022-05-22 DIAGNOSIS — Z803 Family history of malignant neoplasm of breast: Secondary | ICD-10-CM

## 2022-05-22 DIAGNOSIS — Z01419 Encounter for gynecological examination (general) (routine) without abnormal findings: Secondary | ICD-10-CM

## 2022-05-22 DIAGNOSIS — Z1211 Encounter for screening for malignant neoplasm of colon: Secondary | ICD-10-CM

## 2022-05-22 DIAGNOSIS — Z1231 Encounter for screening mammogram for malignant neoplasm of breast: Secondary | ICD-10-CM | POA: Diagnosis not present

## 2022-05-22 NOTE — Patient Instructions (Addendum)
I value your feedback and you entrusting us with your care. If you get a [facility] patient survey, I would appreciate you taking the time to let us know about your experience today. Thank you!  [facility]: [phone]      

## 2022-06-01 ENCOUNTER — Other Ambulatory Visit: Payer: Self-pay

## 2022-06-01 DIAGNOSIS — G43009 Migraine without aura, not intractable, without status migrainosus: Secondary | ICD-10-CM | POA: Diagnosis not present

## 2022-06-01 DIAGNOSIS — Z0000 Encounter for general adult medical examination without abnormal findings: Secondary | ICD-10-CM | POA: Diagnosis not present

## 2022-06-01 DIAGNOSIS — Z9989 Dependence on other enabling machines and devices: Secondary | ICD-10-CM | POA: Diagnosis not present

## 2022-06-01 DIAGNOSIS — F988 Other specified behavioral and emotional disorders with onset usually occurring in childhood and adolescence: Secondary | ICD-10-CM | POA: Diagnosis not present

## 2022-06-01 DIAGNOSIS — F411 Generalized anxiety disorder: Secondary | ICD-10-CM | POA: Diagnosis not present

## 2022-06-01 DIAGNOSIS — G4733 Obstructive sleep apnea (adult) (pediatric): Secondary | ICD-10-CM | POA: Diagnosis not present

## 2022-06-01 MED ORDER — MONTELUKAST SODIUM 10 MG PO TABS
10.0000 mg | ORAL_TABLET | Freq: Every day | ORAL | 3 refills | Status: DC
  Filled 2022-06-01: qty 90, 90d supply, fill #0
  Filled 2022-09-28: qty 90, 90d supply, fill #1
  Filled 2023-01-01 (×3): qty 90, 90d supply, fill #2
  Filled 2023-04-19: qty 90, 90d supply, fill #3

## 2022-06-01 MED ORDER — LEVOCETIRIZINE DIHYDROCHLORIDE 5 MG PO TABS
ORAL_TABLET | ORAL | 6 refills | Status: DC
  Filled 2022-06-01: qty 90, 90d supply, fill #0
  Filled 2022-09-28: qty 90, 90d supply, fill #1
  Filled 2023-01-01 (×2): qty 90, 90d supply, fill #2
  Filled 2023-04-19: qty 90, 90d supply, fill #3

## 2022-06-14 ENCOUNTER — Other Ambulatory Visit: Payer: Self-pay

## 2022-06-19 ENCOUNTER — Ambulatory Visit
Admission: RE | Admit: 2022-06-19 | Discharge: 2022-06-19 | Disposition: A | Discharge status: Home / Self Care | Payer: 59 | Source: Ambulatory Visit | Attending: Obstetrics and Gynecology | Admitting: Obstetrics and Gynecology

## 2022-06-19 DIAGNOSIS — Z1231 Encounter for screening mammogram for malignant neoplasm of breast: Secondary | ICD-10-CM | POA: Diagnosis not present

## 2022-07-09 ENCOUNTER — Other Ambulatory Visit: Payer: Self-pay

## 2022-07-09 MED ORDER — AMPHETAMINE-DEXTROAMPHETAMINE 10 MG PO TABS
10.0000 mg | ORAL_TABLET | Freq: Two times a day (BID) | ORAL | 0 refills | Status: DC
  Filled 2022-07-09: qty 60, 30d supply, fill #0

## 2022-07-18 ENCOUNTER — Other Ambulatory Visit: Payer: Self-pay

## 2022-08-10 ENCOUNTER — Other Ambulatory Visit: Payer: Self-pay

## 2022-08-17 ENCOUNTER — Other Ambulatory Visit: Payer: Self-pay

## 2022-08-19 ENCOUNTER — Other Ambulatory Visit: Payer: Self-pay

## 2022-08-19 MED ORDER — AMPHETAMINE-DEXTROAMPHETAMINE 10 MG PO TABS
10.0000 mg | ORAL_TABLET | Freq: Two times a day (BID) | ORAL | 0 refills | Status: DC
  Filled 2022-08-29: qty 60, 30d supply, fill #0

## 2022-08-28 ENCOUNTER — Other Ambulatory Visit: Payer: Self-pay

## 2022-08-29 ENCOUNTER — Other Ambulatory Visit: Payer: Self-pay

## 2022-08-29 ENCOUNTER — Encounter: Payer: Self-pay | Admitting: Dermatology

## 2022-08-29 ENCOUNTER — Ambulatory Visit (INDEPENDENT_AMBULATORY_CARE_PROVIDER_SITE_OTHER): Payer: 59 | Admitting: Dermatology

## 2022-08-29 DIAGNOSIS — L700 Acne vulgaris: Secondary | ICD-10-CM

## 2022-08-29 DIAGNOSIS — D1800 Hemangioma unspecified site: Secondary | ICD-10-CM

## 2022-08-29 DIAGNOSIS — D239 Other benign neoplasm of skin, unspecified: Secondary | ICD-10-CM

## 2022-08-29 DIAGNOSIS — D229 Melanocytic nevi, unspecified: Secondary | ICD-10-CM

## 2022-08-29 DIAGNOSIS — Z79899 Other long term (current) drug therapy: Secondary | ICD-10-CM | POA: Diagnosis not present

## 2022-08-29 DIAGNOSIS — L814 Other melanin hyperpigmentation: Secondary | ICD-10-CM | POA: Diagnosis not present

## 2022-08-29 DIAGNOSIS — L821 Other seborrheic keratosis: Secondary | ICD-10-CM | POA: Diagnosis not present

## 2022-08-29 DIAGNOSIS — Z86018 Personal history of other benign neoplasm: Secondary | ICD-10-CM

## 2022-08-29 DIAGNOSIS — L578 Other skin changes due to chronic exposure to nonionizing radiation: Secondary | ICD-10-CM | POA: Diagnosis not present

## 2022-08-29 DIAGNOSIS — Z1283 Encounter for screening for malignant neoplasm of skin: Secondary | ICD-10-CM | POA: Diagnosis not present

## 2022-08-29 MED ORDER — TRETINOIN 0.025 % EX CREA
TOPICAL_CREAM | Freq: Every day | CUTANEOUS | 6 refills | Status: DC
  Filled 2022-08-29: qty 45, 90d supply, fill #0
  Filled 2022-12-02: qty 45, 90d supply, fill #1
  Filled 2023-03-18: qty 45, 90d supply, fill #2
  Filled 2023-05-20 – 2023-08-26 (×2): qty 45, 90d supply, fill #3

## 2022-08-29 NOTE — Progress Notes (Signed)
Follow-Up Visit   Subjective  Vanessa Kim is a 46 y.o. female who presents for the following: Annual Exam. Hx of Dysplastic nevus.  The patient presents for Total-Body Skin Exam (TBSE) for skin cancer screening and mole check.  The patient has spots, moles and lesions to be evaluated, some may be new or changing and the patient has concerns that these could be cancer.   The following portions of the chart were reviewed this encounter and updated as appropriate:   Tobacco  Allergies  Meds  Problems  Med Hx  Surg Hx  Fam Hx     Review of Systems:  No other skin or systemic complaints except as noted in HPI or Assessment and Plan.  Objective  Well appearing patient in no apparent distress; mood and affect are within normal limits.  A full examination was performed including scalp, head, eyes, ears, nose, lips, neck, chest, axillae, abdomen, back, buttocks, bilateral upper extremities, bilateral lower extremities, hands, feet, fingers, toes, fingernails, and toenails. All findings within normal limits unless otherwise noted below.  face Mild Open and closed comedones  nasal tip 0.5 cm light brown macule at nasal tip    Assessment & Plan  Acne vulgaris face  Start Tretinoin 0.025% cream apply to face at bedtime   Topical retinoid medications like tretinoin/Retin-A, adapalene/Differin, tazarotene/Fabior, and Epiduo/Epiduo Forte can cause dryness and irritation when first started. Only apply a pea-sized amount to the entire affected area. Avoid applying it around the eyes, edges of mouth and creases at the nose. If you experience irritation, use a good moisturizer first and/or apply the medicine less often. If you are doing well with the medicine, you can increase how often you use it until you are applying every night. Be careful with sun protection while using this medication as it can make you sensitive to the sun. This medicine should not be used by pregnant women.      Related Medications tretinoin (RETIN-A) 0.025 % cream Apply topically at bedtime.  Lentigo = 0.5 cm light brown subtle macule  nasal tip See photo Start Tretinoin 0.025% cream apply to face/nose at bedtime  Recheck if changes. Biopsy if changes Benign-appearing.  Observation.  Call clinic for new or changing lesions.  Recommend daily use of broad spectrum spf 30+ sunscreen to sun-exposed areas.  Recheck 3 mos  Lentigines - Scattered tan macules - Due to sun exposure - Benign-appearing, observe - Recommend daily broad spectrum sunscreen SPF 30+ to sun-exposed areas, reapply every 2 hours as needed. - Call for any changes  Seborrheic Keratoses - Stuck-on, waxy, tan-brown papules and/or plaques  - Benign-appearing - Discussed benign etiology and prognosis. - Observe - Call for any changes  Melanocytic Nevi - Tan-brown and/or pink-flesh-colored symmetric macules and papules - Benign appearing on exam today - Observation - Call clinic for new or changing moles - Recommend daily use of broad spectrum spf 30+ sunscreen to sun-exposed areas.   Hemangiomas - Red papules - Discussed benign nature - Observe - Call for any changes  Actinic Damage - Chronic condition, secondary to cumulative UV/sun exposure - diffuse scaly erythematous macules with underlying dyspigmentation - Recommend daily broad spectrum sunscreen SPF 30+ to sun-exposed areas, reapply every 2 hours as needed.  - Staying in the shade or wearing long sleeves, sun glasses (UVA+UVB protection) and wide brim hats (4-inch brim around the entire circumference of the hat) are also recommended for sun protection.  - Call for new or changing lesions.  History of Dysplastic Nevi Multiple see history - No evidence of recurrence today - Recommend regular full body skin exams - Recommend daily broad spectrum sunscreen SPF 30+ to sun-exposed areas, reapply every 2 hours as needed.  - Call if any new or changing lesions  are noted between office visits   Skin cancer screening performed today.   Return in about 1 year (around 08/30/2023) for TBSE, hx of Dysplastic nevus . Recheck nose in 3 mos.  IMarye Round, CMA, am acting as scribe for Sarina Ser, MD .  Documentation: I have reviewed the above documentation for accuracy and completeness, and I agree with the above.  Sarina Ser, MD

## 2022-08-29 NOTE — Patient Instructions (Signed)
Due to recent changes in healthcare laws, you may see results of your pathology and/or laboratory studies on MyChart before the doctors have had a chance to review them. We understand that in some cases there may be results that are confusing or concerning to you. Please understand that not all results are received at the same time and often the doctors may need to interpret multiple results in order to provide you with the best plan of care or course of treatment. Therefore, we ask that you please give us 2 business days to thoroughly review all your results before contacting the office for clarification. Should we see a critical lab result, you will be contacted sooner.   If You Need Anything After Your Visit  If you have any questions or concerns for your doctor, please call our main line at [phone] and press option 4 to reach your doctor's medical assistant. If no one answers, please leave a voicemail as directed and we will return your call as soon as possible. Messages left after 4 pm will be answered the following business day.   You may also send us a message via MyChart. We typically respond to MyChart messages within 1-2 business days.  For prescription refills, please ask your pharmacy to contact our office. Our fax number is [phone].  If you have an urgent issue when the clinic is closed that cannot wait until the next business day, you can page your doctor at the number below.    Please note that while we do our best to be available for urgent issues outside of office hours, we are not available 24/7.   If you have an urgent issue and are unable to reach us, you may choose to seek medical care at your doctor's office, retail clinic, urgent care center, or emergency room.  If you have a medical emergency, please immediately call 911 or go to the emergency department.  Pager Numbers  - Dr. Kowalski: [phone]  - Dr. Moye: [phone]  - Dr. Stewart:  [phone]  In the event of inclement weather, please call our main line at [phone] for an update on the status of any delays or closures.  Dermatology Medication Tips: Please keep the boxes that topical medications come in in order to help keep track of the instructions about where and how to use these. Pharmacies typically print the medication instructions only on the boxes and not directly on the medication tubes.   If your medication is too expensive, please contact our office at [phone] option 4 or send us a message through MyChart.   We are unable to tell what your co-pay for medications will be in advance as this is different depending on your insurance coverage. However, we may be able to find a substitute medication at lower cost or fill out paperwork to get insurance to cover a needed medication.   If a prior authorization is required to get your medication covered by your insurance company, please allow us 1-2 business days to complete this process.  Drug prices often vary depending on where the prescription is filled and some pharmacies may offer cheaper prices.  The website www.goodrx.com contains coupons for medications through different pharmacies. The prices here do not account for what the cost may be with help from insurance (it may be cheaper with your insurance), but the website can give you the price if you did not use any insurance.  - You can print the associated coupon and take it with   your prescription to the pharmacy.  - You may also stop by our office during regular business hours and pick up a GoodRx coupon card.  - If you need your prescription sent electronically to a different pharmacy, notify our office through [facility] MyChart or by phone at [phone] option 4.     Si Usted Necesita Algo Despus de Su Visita  Tambin puede enviarnos un mensaje a travs de MyChart. Por lo general respondemos a los mensajes de MyChart en el transcurso de 1 a 2  das hbiles.  Para renovar recetas, por favor pida a su farmacia que se ponga en contacto con nuestra oficina. Nuestro nmero de fax es el [phone].  Si tiene un asunto urgente cuando la clnica est cerrada y que no puede esperar hasta el siguiente da hbil, puede llamar/localizar a su doctor(a) al nmero que aparece a continuacin.   Por favor, tenga en cuenta que aunque hacemos todo lo posible para estar disponibles para asuntos urgentes fuera del horario de oficina, no estamos disponibles las 24 horas del da, los 7 das de la semana.   Si tiene un problema urgente y no puede comunicarse con nosotros, puede optar por buscar atencin mdica  en el consultorio de su doctor(a), en una clnica privada, en un centro de atencin urgente o en una sala de emergencias.  Si tiene una emergencia mdica, por favor llame inmediatamente al 911 o vaya a la sala de emergencias.  Nmeros de bper  - Dr. Kowalski: [phone]  - Dra. Moye: [phone]  - Dra. Stewart: [phone]  En caso de inclemencias del tiempo, por favor llame a nuestra lnea principal al [phone] para una actualizacin sobre el estado de cualquier retraso o cierre.  Consejos para la medicacin en dermatologa: Por favor, guarde las cajas en las que vienen los medicamentos de uso tpico para ayudarle a seguir las instrucciones sobre dnde y cmo usarlos. Las farmacias generalmente imprimen las instrucciones del medicamento slo en las cajas y no directamente en los tubos del medicamento.   Si su medicamento es muy caro, por favor, pngase en contacto con nuestra oficina llamando al [phone] y presione la opcin 4 o envenos un mensaje a travs de MyChart.   No podemos decirle cul ser su copago por los medicamentos por adelantado ya que esto es diferente dependiendo de la cobertura de su seguro. Sin embargo, es posible que podamos encontrar un medicamento sustituto a menor costo o llenar un formulario para que el  seguro cubra el medicamento que se considera necesario.   Si se requiere una autorizacin previa para que su compaa de seguros cubra su medicamento, por favor permtanos de 1 a 2 das hbiles para completar este proceso.  Los precios de los medicamentos varan con frecuencia dependiendo del lugar de dnde se surte la receta y alguna farmacias pueden ofrecer precios ms baratos.  El sitio web www.goodrx.com tiene cupones para medicamentos de diferentes farmacias. Los precios aqu no tienen en cuenta lo que podra costar con la ayuda del seguro (puede ser ms barato con su seguro), pero el sitio web puede darle el precio si no utiliz ningn seguro.  - Puede imprimir el cupn correspondiente y llevarlo con su receta a la farmacia.  - Tambin puede pasar por nuestra oficina durante el horario de atencin regular y recoger una tarjeta de cupones de GoodRx.  - Si necesita que su receta se enve electrnicamente a una farmacia diferente, informe a nuestra oficina a travs de MyChart de [facility]   o por telfono llamando al [phone] y presione la opcin 4.  

## 2022-08-30 ENCOUNTER — Telehealth: Payer: Self-pay

## 2022-08-30 NOTE — Telephone Encounter (Signed)
Called pt scheduled her for recheck of a spot on her nose Dec 7

## 2022-09-28 ENCOUNTER — Other Ambulatory Visit: Payer: Self-pay

## 2022-09-28 ENCOUNTER — Other Ambulatory Visit: Payer: Self-pay | Admitting: Dermatology

## 2022-10-01 ENCOUNTER — Other Ambulatory Visit: Payer: Self-pay

## 2022-10-01 MED ORDER — ALPRAZOLAM 0.25 MG PO TABS
0.2500 mg | ORAL_TABLET | Freq: Two times a day (BID) | ORAL | 5 refills | Status: AC
  Filled 2022-10-01: qty 30, 15d supply, fill #0
  Filled 2022-11-01: qty 30, 15d supply, fill #1
  Filled 2022-12-02: qty 30, 15d supply, fill #2
  Filled 2023-01-01 (×2): qty 30, 15d supply, fill #3
  Filled 2023-03-18: qty 30, 15d supply, fill #4

## 2022-10-01 MED ORDER — AMPHETAMINE-DEXTROAMPHETAMINE 10 MG PO TABS
10.0000 mg | ORAL_TABLET | Freq: Two times a day (BID) | ORAL | 0 refills | Status: DC
  Filled 2022-10-01: qty 60, 30d supply, fill #0

## 2022-10-02 ENCOUNTER — Other Ambulatory Visit: Payer: Self-pay

## 2022-10-02 ENCOUNTER — Other Ambulatory Visit: Payer: Self-pay | Admitting: Dermatology

## 2022-10-02 MED ORDER — DOXYCYCLINE MONOHYDRATE 50 MG PO CAPS
ORAL_CAPSULE | ORAL | 4 refills | Status: DC
  Filled 2022-10-02: qty 30, 30d supply, fill #0
  Filled 2022-11-01: qty 30, 30d supply, fill #1
  Filled 2022-12-02: qty 30, 30d supply, fill #2
  Filled 2023-01-01 (×2): qty 30, 30d supply, fill #3
  Filled 2023-02-11: qty 30, 30d supply, fill #4

## 2022-10-11 DIAGNOSIS — H524 Presbyopia: Secondary | ICD-10-CM | POA: Diagnosis not present

## 2022-10-29 ENCOUNTER — Other Ambulatory Visit: Payer: Self-pay

## 2022-10-29 MED ORDER — AMPHETAMINE-DEXTROAMPHETAMINE 10 MG PO TABS
10.0000 mg | ORAL_TABLET | Freq: Two times a day (BID) | ORAL | 0 refills | Status: DC
  Filled 2022-11-01: qty 60, 30d supply, fill #0

## 2022-11-01 ENCOUNTER — Other Ambulatory Visit: Payer: Self-pay

## 2022-11-02 ENCOUNTER — Other Ambulatory Visit: Payer: Self-pay

## 2022-11-07 ENCOUNTER — Other Ambulatory Visit: Payer: Self-pay

## 2022-11-07 DIAGNOSIS — M25511 Pain in right shoulder: Secondary | ICD-10-CM | POA: Diagnosis not present

## 2022-11-07 DIAGNOSIS — M5412 Radiculopathy, cervical region: Secondary | ICD-10-CM | POA: Diagnosis not present

## 2022-11-07 DIAGNOSIS — M542 Cervicalgia: Secondary | ICD-10-CM | POA: Diagnosis not present

## 2022-11-07 MED ORDER — PREDNISONE 10 MG PO TABS
ORAL_TABLET | ORAL | 0 refills | Status: DC
  Filled 2022-11-07: qty 42, 12d supply, fill #0

## 2022-11-07 MED ORDER — CYCLOBENZAPRINE HCL 10 MG PO TABS
10.0000 mg | ORAL_TABLET | Freq: Three times a day (TID) | ORAL | 0 refills | Status: DC | PRN
  Filled 2022-11-07: qty 30, 10d supply, fill #0

## 2022-11-12 ENCOUNTER — Ambulatory Visit: Payer: 59 | Admitting: Orthopedic Surgery

## 2022-12-02 ENCOUNTER — Other Ambulatory Visit: Payer: Self-pay

## 2022-12-02 MED ORDER — AMPHETAMINE-DEXTROAMPHETAMINE 10 MG PO TABS
10.0000 mg | ORAL_TABLET | Freq: Two times a day (BID) | ORAL | 0 refills | Status: DC
  Filled 2022-12-02: qty 60, 30d supply, fill #0

## 2022-12-03 ENCOUNTER — Other Ambulatory Visit: Payer: Self-pay

## 2022-12-04 ENCOUNTER — Other Ambulatory Visit: Payer: Self-pay

## 2022-12-06 ENCOUNTER — Ambulatory Visit (INDEPENDENT_AMBULATORY_CARE_PROVIDER_SITE_OTHER): Payer: 59 | Admitting: Dermatology

## 2022-12-06 VITALS — BP 156/91 | HR 85

## 2022-12-06 DIAGNOSIS — I781 Nevus, non-neoplastic: Secondary | ICD-10-CM | POA: Diagnosis not present

## 2022-12-06 DIAGNOSIS — L578 Other skin changes due to chronic exposure to nonionizing radiation: Secondary | ICD-10-CM

## 2022-12-06 DIAGNOSIS — L814 Other melanin hyperpigmentation: Secondary | ICD-10-CM

## 2022-12-06 NOTE — Progress Notes (Signed)
   Follow-Up Visit   Subjective  Vanessa Kim is a 46 y.o. female who presents for the following: Lentigo to recheck (Nasal tip, Tretinoin 0.025% qhs, pt thinks has lightened up). The patient has spots, moles and lesions to be evaluated, some may be new or changing and the patient has concerns .  The following portions of the chart were reviewed this encounter and updated as appropriate:   Tobacco  Allergies  Meds  Problems  Med Hx  Surg Hx  Fam Hx     Review of Systems:  No other skin or systemic complaints except as noted in HPI or Assessment and Plan.  Objective  Well appearing patient in no apparent distress; mood and affect are within normal limits.  A focused examination was performed including face. Relevant physical exam findings are noted in the Assessment and Plan.  nasal tip Light brown subtle macule nasal tip     nose Red macules      Assessment & Plan  Lentigines nasal tip  Cont Tretinoin 0.025% qhs Plan light LN2 treatment on f/u, discussed cosmetic fee  Topical retinoid medications like tretinoin/Retin-A, adapalene/Differin, tazarotene/Fabior, and Epiduo/Epiduo Forte can cause dryness and irritation when first started. Only apply a pea-sized amount to the entire affected area. Avoid applying it around the eyes, edges of mouth and creases at the nose. If you experience irritation, use a good moisturizer first and/or apply the medicine less often. If you are doing well with the medicine, you can increase how often you use it until you are applying every night. Be careful with sun protection while using this medication as it can make you sensitive to the sun. This medicine should not be used by pregnant women.    Telangiectasias nose  Plan ED on f/u  Discussed cosmetic procedure, noncovered.  $60 for 1st lesion and $15 for each additional lesion if done on the same day.  Maximum charge $350.  One touch-up treatment included no charge. Discussed risks  of treatment including dyspigmentation, small scar, and/or recurrence. Recommend daily broad spectrum sunscreen SPF 30+/photoprotection to treated areas once healed.   Actinic Damage - chronic, secondary to cumulative UV radiation exposure/sun exposure over time - diffuse scaly erythematous macules with underlying dyspigmentation - Recommend daily broad spectrum sunscreen SPF 30+ to sun-exposed areas, reapply every 2 hours as needed.  - Recommend staying in the shade or wearing long sleeves, sun glasses (UVA+UVB protection) and wide brim hats (4-inch brim around the entire circumference of the hat). - Call for new or changing lesions.  Return for to be scheduled in Jan 2024 for cosmetic ED and LN2.  I, Othelia Pulling, RMA, am acting as scribe for Sarina Ser, MD . Documentation: I have reviewed the above documentation for accuracy and completeness, and I agree with the above.  Sarina Ser, MD

## 2022-12-06 NOTE — Patient Instructions (Signed)
Due to recent changes in healthcare laws, you may see results of your pathology and/or laboratory studies on MyChart before the doctors have had a chance to review them. We understand that in some cases there may be results that are confusing or concerning to you. Please understand that not all results are received at the same time and often the doctors may need to interpret multiple results in order to provide you with the best plan of care or course of treatment. Therefore, we ask that you please give us 2 business days to thoroughly review all your results before contacting the office for clarification. Should we see a critical lab result, you will be contacted sooner.   If You Need Anything After Your Visit  If you have any questions or concerns for your doctor, please call our main line at [phone] and press option 4 to reach your doctor's medical assistant. If no one answers, please leave a voicemail as directed and we will return your call as soon as possible. Messages left after 4 pm will be answered the following business day.   You may also send us a message via MyChart. We typically respond to MyChart messages within 1-2 business days.  For prescription refills, please ask your pharmacy to contact our office. Our fax number is [phone].  If you have an urgent issue when the clinic is closed that cannot wait until the next business day, you can page your doctor at the number below.    Please note that while we do our best to be available for urgent issues outside of office hours, we are not available 24/7.   If you have an urgent issue and are unable to reach us, you may choose to seek medical care at your doctor's office, retail clinic, urgent care center, or emergency room.  If you have a medical emergency, please immediately call 911 or go to the emergency department.  Pager Numbers  - Dr. Kowalski: [phone]  - Dr. Moye: [phone]  - Dr. Stewart:  [phone]  In the event of inclement weather, please call our main line at [phone] for an update on the status of any delays or closures.  Dermatology Medication Tips: Please keep the boxes that topical medications come in in order to help keep track of the instructions about where and how to use these. Pharmacies typically print the medication instructions only on the boxes and not directly on the medication tubes.   If your medication is too expensive, please contact our office at [phone] option 4 or send us a message through MyChart.   We are unable to tell what your co-pay for medications will be in advance as this is different depending on your insurance coverage. However, we may be able to find a substitute medication at lower cost or fill out paperwork to get insurance to cover a needed medication.   If a prior authorization is required to get your medication covered by your insurance company, please allow us 1-2 business days to complete this process.  Drug prices often vary depending on where the prescription is filled and some pharmacies may offer cheaper prices.  The website www.goodrx.com contains coupons for medications through different pharmacies. The prices here do not account for what the cost may be with help from insurance (it may be cheaper with your insurance), but the website can give you the price if you did not use any insurance.  - You can print the associated coupon and take it with   your prescription to the pharmacy.  - You may also stop by our office during regular business hours and pick up a GoodRx coupon card.  - If you need your prescription sent electronically to a different pharmacy, notify our office through [facility] MyChart or by phone at [phone] option 4.     Si Usted Necesita Algo Despus de Su Visita  Tambin puede enviarnos un mensaje a travs de MyChart. Por lo general respondemos a los mensajes de MyChart en el transcurso de 1 a 2  das hbiles.  Para renovar recetas, por favor pida a su farmacia que se ponga en contacto con nuestra oficina. Nuestro nmero de fax es el [phone].  Si tiene un asunto urgente cuando la clnica est cerrada y que no puede esperar hasta el siguiente da hbil, puede llamar/localizar a su doctor(a) al nmero que aparece a continuacin.   Por favor, tenga en cuenta que aunque hacemos todo lo posible para estar disponibles para asuntos urgentes fuera del horario de oficina, no estamos disponibles las 24 horas del da, los 7 das de la semana.   Si tiene un problema urgente y no puede comunicarse con nosotros, puede optar por buscar atencin mdica  en el consultorio de su doctor(a), en una clnica privada, en un centro de atencin urgente o en una sala de emergencias.  Si tiene una emergencia mdica, por favor llame inmediatamente al 911 o vaya a la sala de emergencias.  Nmeros de bper  - Dr. Kowalski: [phone]  - Dra. Moye: [phone]  - Dra. Stewart: [phone]  En caso de inclemencias del tiempo, por favor llame a nuestra lnea principal al [phone] para una actualizacin sobre el estado de cualquier retraso o cierre.  Consejos para la medicacin en dermatologa: Por favor, guarde las cajas en las que vienen los medicamentos de uso tpico para ayudarle a seguir las instrucciones sobre dnde y cmo usarlos. Las farmacias generalmente imprimen las instrucciones del medicamento slo en las cajas y no directamente en los tubos del medicamento.   Si su medicamento es muy caro, por favor, pngase en contacto con nuestra oficina llamando al [phone] y presione la opcin 4 o envenos un mensaje a travs de MyChart.   No podemos decirle cul ser su copago por los medicamentos por adelantado ya que esto es diferente dependiendo de la cobertura de su seguro. Sin embargo, es posible que podamos encontrar un medicamento sustituto a menor costo o llenar un formulario para que el  seguro cubra el medicamento que se considera necesario.   Si se requiere una autorizacin previa para que su compaa de seguros cubra su medicamento, por favor permtanos de 1 a 2 das hbiles para completar este proceso.  Los precios de los medicamentos varan con frecuencia dependiendo del lugar de dnde se surte la receta y alguna farmacias pueden ofrecer precios ms baratos.  El sitio web www.goodrx.com tiene cupones para medicamentos de diferentes farmacias. Los precios aqu no tienen en cuenta lo que podra costar con la ayuda del seguro (puede ser ms barato con su seguro), pero el sitio web puede darle el precio si no utiliz ningn seguro.  - Puede imprimir el cupn correspondiente y llevarlo con su receta a la farmacia.  - Tambin puede pasar por nuestra oficina durante el horario de atencin regular y recoger una tarjeta de cupones de GoodRx.  - Si necesita que su receta se enve electrnicamente a una farmacia diferente, informe a nuestra oficina a travs de MyChart de [facility]   o por telfono llamando al [phone] y presione la opcin 4.  

## 2022-12-16 ENCOUNTER — Encounter: Payer: Self-pay | Admitting: Dermatology

## 2023-01-01 ENCOUNTER — Other Ambulatory Visit: Payer: Self-pay

## 2023-01-01 MED ORDER — AMPHETAMINE-DEXTROAMPHETAMINE 10 MG PO TABS
10.0000 mg | ORAL_TABLET | Freq: Two times a day (BID) | ORAL | 0 refills | Status: DC
  Filled 2023-01-01: qty 60, 30d supply, fill #0

## 2023-01-02 ENCOUNTER — Other Ambulatory Visit: Payer: Self-pay

## 2023-01-16 ENCOUNTER — Ambulatory Visit (INDEPENDENT_AMBULATORY_CARE_PROVIDER_SITE_OTHER): Payer: Self-pay | Admitting: Dermatology

## 2023-01-16 DIAGNOSIS — L814 Other melanin hyperpigmentation: Secondary | ICD-10-CM

## 2023-01-16 DIAGNOSIS — I781 Nevus, non-neoplastic: Secondary | ICD-10-CM

## 2023-01-16 NOTE — Patient Instructions (Signed)
Due to recent changes in healthcare laws, you may see results of your pathology and/or laboratory studies on MyChart before the doctors have had a chance to review them. We understand that in some cases there may be results that are confusing or concerning to you. Please understand that not all results are received at the same time and often the doctors may need to interpret multiple results in order to provide you with the best plan of care or course of treatment. Therefore, we ask that you please give us 2 business days to thoroughly review all your results before contacting the office for clarification. Should we see a critical lab result, you will be contacted sooner.   If You Need Anything After Your Visit  If you have any questions or concerns for your doctor, please call our main line at [phone] and press option 4 to reach your doctor's medical assistant. If no one answers, please leave a voicemail as directed and we will return your call as soon as possible. Messages left after 4 pm will be answered the following business day.   You may also send us a message via MyChart. We typically respond to MyChart messages within 1-2 business days.  For prescription refills, please ask your pharmacy to contact our office. Our fax number is [phone].  If you have an urgent issue when the clinic is closed that cannot wait until the next business day, you can page your doctor at the number below.    Please note that while we do our best to be available for urgent issues outside of office hours, we are not available 24/7.   If you have an urgent issue and are unable to reach us, you may choose to seek medical care at your doctor's office, retail clinic, urgent care center, or emergency room.  If you have a medical emergency, please immediately call 911 or go to the emergency department.  Pager Numbers  - Dr. Kowalski: [phone]  - Dr. Moye: [phone]  - Dr. Stewart:  [phone]  In the event of inclement weather, please call our main line at [phone] for an update on the status of any delays or closures.  Dermatology Medication Tips: Please keep the boxes that topical medications come in in order to help keep track of the instructions about where and how to use these. Pharmacies typically print the medication instructions only on the boxes and not directly on the medication tubes.   If your medication is too expensive, please contact our office at [phone] option 4 or send us a message through MyChart.   We are unable to tell what your co-pay for medications will be in advance as this is different depending on your insurance coverage. However, we may be able to find a substitute medication at lower cost or fill out paperwork to get insurance to cover a needed medication.   If a prior authorization is required to get your medication covered by your insurance company, please allow us 1-2 business days to complete this process.  Drug prices often vary depending on where the prescription is filled and some pharmacies may offer cheaper prices.  The website www.goodrx.com contains coupons for medications through different pharmacies. The prices here do not account for what the cost may be with help from insurance (it may be cheaper with your insurance), but the website can give you the price if you did not use any insurance.  - You can print the associated coupon and take it with   your prescription to the pharmacy.  - You may also stop by our office during regular business hours and pick up a GoodRx coupon card.  - If you need your prescription sent electronically to a different pharmacy, notify our office through [facility] MyChart or by phone at [phone] option 4.     Si Usted Necesita Algo Despus de Su Visita  Tambin puede enviarnos un mensaje a travs de MyChart. Por lo general respondemos a los mensajes de MyChart en el transcurso de 1 a 2  das hbiles.  Para renovar recetas, por favor pida a su farmacia que se ponga en contacto con nuestra oficina. Nuestro nmero de fax es el [phone].  Si tiene un asunto urgente cuando la clnica est cerrada y que no puede esperar hasta el siguiente da hbil, puede llamar/localizar a su doctor(a) al nmero que aparece a continuacin.   Por favor, tenga en cuenta que aunque hacemos todo lo posible para estar disponibles para asuntos urgentes fuera del horario de oficina, no estamos disponibles las 24 horas del da, los 7 das de la semana.   Si tiene un problema urgente y no puede comunicarse con nosotros, puede optar por buscar atencin mdica  en el consultorio de su doctor(a), en una clnica privada, en un centro de atencin urgente o en una sala de emergencias.  Si tiene una emergencia mdica, por favor llame inmediatamente al 911 o vaya a la sala de emergencias.  Nmeros de bper  - Dr. Kowalski: [phone]  - Dra. Moye: [phone]  - Dra. Stewart: [phone]  En caso de inclemencias del tiempo, por favor llame a nuestra lnea principal al [phone] para una actualizacin sobre el estado de cualquier retraso o cierre.  Consejos para la medicacin en dermatologa: Por favor, guarde las cajas en las que vienen los medicamentos de uso tpico para ayudarle a seguir las instrucciones sobre dnde y cmo usarlos. Las farmacias generalmente imprimen las instrucciones del medicamento slo en las cajas y no directamente en los tubos del medicamento.   Si su medicamento es muy caro, por favor, pngase en contacto con nuestra oficina llamando al [phone] y presione la opcin 4 o envenos un mensaje a travs de MyChart.   No podemos decirle cul ser su copago por los medicamentos por adelantado ya que esto es diferente dependiendo de la cobertura de su seguro. Sin embargo, es posible que podamos encontrar un medicamento sustituto a menor costo o llenar un formulario para que el  seguro cubra el medicamento que se considera necesario.   Si se requiere una autorizacin previa para que su compaa de seguros cubra su medicamento, por favor permtanos de 1 a 2 das hbiles para completar este proceso.  Los precios de los medicamentos varan con frecuencia dependiendo del lugar de dnde se surte la receta y alguna farmacias pueden ofrecer precios ms baratos.  El sitio web www.goodrx.com tiene cupones para medicamentos de diferentes farmacias. Los precios aqu no tienen en cuenta lo que podra costar con la ayuda del seguro (puede ser ms barato con su seguro), pero el sitio web puede darle el precio si no utiliz ningn seguro.  - Puede imprimir el cupn correspondiente y llevarlo con su receta a la farmacia.  - Tambin puede pasar por nuestra oficina durante el horario de atencin regular y recoger una tarjeta de cupones de GoodRx.  - Si necesita que su receta se enve electrnicamente a una farmacia diferente, informe a nuestra oficina a travs de MyChart de [facility]   o por telfono llamando al [phone] y presione la opcin 4.  

## 2023-01-16 NOTE — Progress Notes (Signed)
   Follow-Up Visit   Subjective  Vanessa Kim is a 47 y.o. female who presents for the following: Follow-up. The patient has spots, moles and lesions to be evaluated, some may be new or changing and the patient has concerns that these could be cancer.  The following portions of the chart were reviewed this encounter and updated as appropriate:   Tobacco  Allergies  Meds  Problems  Med Hx  Surg Hx  Fam Hx     Review of Systems:  No other skin or systemic complaints except as noted in HPI or Assessment and Plan.  Objective  Well appearing patient in no apparent distress; mood and affect are within normal limits.  A focused examination was performed including nose . Relevant physical exam findings are noted in the Assessment and Plan.  nasal tip x 1 Scattered tan macules.   nose x 7 (7) Telangectasia's    Assessment & Plan  Lentigines nasal tip x 1 Destruction of lesion - nasal tip x 1 Complexity: simple   Destruction method: cryotherapy   Informed consent: discussed and consent obtained   Timeout:  patient name, date of birth, surgical site, and procedure verified Lesion destroyed using liquid nitrogen: Yes   Region frozen until ice ball extended beyond lesion: Yes   Outcome: patient tolerated procedure well with no complications   Post-procedure details: wound care instructions given    Telangiectasia (7) nose x 7 Destruction of lesion - nose x 7 Complexity: simple   Destruction method comment:  Electrodesiccation Informed consent: discussed and consent obtained   Hemostasis achieved with:  electrodesiccation Outcome: patient tolerated procedure well with no complications   Post-procedure details: wound care instructions given   Additional details:  Prior to procedure, discussed risks of blister formation, small wound, skin dyspigmentation, or rare scar following cryotherapy. Recommend Vaseline ointment to treated areas while healing.   Return if symptoms  worsen or fail to improve.  IMarye Round, CMA, am acting as scribe for Sarina Ser, MD .  Documentation: I have reviewed the above documentation for accuracy and completeness, and I agree with the above.  Sarina Ser, MD

## 2023-01-29 ENCOUNTER — Encounter: Payer: Self-pay | Admitting: Dermatology

## 2023-02-11 ENCOUNTER — Other Ambulatory Visit: Payer: Self-pay

## 2023-02-11 MED ORDER — ALPRAZOLAM 0.25 MG PO TABS
0.2500 mg | ORAL_TABLET | Freq: Two times a day (BID) | ORAL | 5 refills | Status: DC | PRN
  Filled 2023-02-11: qty 30, 15d supply, fill #0
  Filled 2023-04-19: qty 30, 15d supply, fill #1
  Filled 2023-05-20: qty 30, 15d supply, fill #2
  Filled 2023-06-25: qty 30, 15d supply, fill #3
  Filled 2023-07-25 – 2023-07-26 (×2): qty 30, 15d supply, fill #4

## 2023-02-11 MED ORDER — AMPHETAMINE-DEXTROAMPHETAMINE 10 MG PO TABS
10.0000 mg | ORAL_TABLET | Freq: Two times a day (BID) | ORAL | 0 refills | Status: DC
  Filled 2023-02-11: qty 60, 30d supply, fill #0

## 2023-03-18 ENCOUNTER — Other Ambulatory Visit: Payer: Self-pay

## 2023-03-18 ENCOUNTER — Other Ambulatory Visit: Payer: Self-pay | Admitting: Dermatology

## 2023-03-18 MED ORDER — AMPHETAMINE-DEXTROAMPHETAMINE 10 MG PO TABS
10.0000 mg | ORAL_TABLET | Freq: Two times a day (BID) | ORAL | 0 refills | Status: DC
  Filled 2023-03-18: qty 60, 30d supply, fill #0

## 2023-03-18 MED ORDER — DOXYCYCLINE MONOHYDRATE 50 MG PO CAPS
50.0000 mg | ORAL_CAPSULE | Freq: Every evening | ORAL | 4 refills | Status: DC
  Filled 2023-03-18: qty 30, 30d supply, fill #0
  Filled 2023-04-19: qty 30, 30d supply, fill #1
  Filled 2023-05-20: qty 30, 30d supply, fill #2
  Filled 2023-06-25: qty 30, 30d supply, fill #3
  Filled 2023-07-25: qty 30, 30d supply, fill #4

## 2023-04-19 ENCOUNTER — Other Ambulatory Visit: Payer: Self-pay

## 2023-04-21 ENCOUNTER — Other Ambulatory Visit: Payer: Self-pay

## 2023-04-21 MED ORDER — AMPHETAMINE-DEXTROAMPHETAMINE 10 MG PO TABS
10.0000 mg | ORAL_TABLET | Freq: Two times a day (BID) | ORAL | 0 refills | Status: DC
  Filled 2023-04-21: qty 60, 30d supply, fill #0

## 2023-04-22 ENCOUNTER — Other Ambulatory Visit: Payer: Self-pay

## 2023-05-20 ENCOUNTER — Other Ambulatory Visit: Payer: Self-pay

## 2023-05-20 MED ORDER — AMPHETAMINE-DEXTROAMPHETAMINE 10 MG PO TABS
10.0000 mg | ORAL_TABLET | Freq: Two times a day (BID) | ORAL | 0 refills | Status: DC
  Filled 2023-05-20: qty 60, 30d supply, fill #0

## 2023-05-23 DIAGNOSIS — G4733 Obstructive sleep apnea (adult) (pediatric): Secondary | ICD-10-CM | POA: Diagnosis not present

## 2023-06-05 ENCOUNTER — Other Ambulatory Visit: Payer: Self-pay

## 2023-06-05 MED ORDER — LEVOCETIRIZINE DIHYDROCHLORIDE 5 MG PO TABS
5.0000 mg | ORAL_TABLET | Freq: Every evening | ORAL | 3 refills | Status: AC
  Filled 2023-06-05: qty 90, 90d supply, fill #0

## 2023-06-05 MED ORDER — MONTELUKAST SODIUM 10 MG PO TABS
10.0000 mg | ORAL_TABLET | Freq: Every day | ORAL | 3 refills | Status: AC
  Filled 2023-06-05: qty 90, 90d supply, fill #0
  Filled 2023-08-26: qty 90, 90d supply, fill #1
  Filled 2024-01-30: qty 90, 90d supply, fill #2
  Filled 2024-05-21: qty 90, 90d supply, fill #3

## 2023-06-12 ENCOUNTER — Other Ambulatory Visit: Payer: Self-pay

## 2023-06-25 ENCOUNTER — Other Ambulatory Visit: Payer: Self-pay

## 2023-06-25 MED ORDER — AMPHETAMINE-DEXTROAMPHETAMINE 10 MG PO TABS
10.0000 mg | ORAL_TABLET | Freq: Two times a day (BID) | ORAL | 0 refills | Status: DC
  Filled 2023-06-25: qty 60, 30d supply, fill #0

## 2023-06-26 ENCOUNTER — Other Ambulatory Visit: Payer: Self-pay

## 2023-07-26 ENCOUNTER — Other Ambulatory Visit: Payer: Self-pay

## 2023-07-28 ENCOUNTER — Other Ambulatory Visit: Payer: Self-pay

## 2023-07-28 MED ORDER — AMPHETAMINE-DEXTROAMPHETAMINE 10 MG PO TABS
10.0000 mg | ORAL_TABLET | Freq: Two times a day (BID) | ORAL | 0 refills | Status: DC
  Filled 2023-07-28: qty 60, 30d supply, fill #0

## 2023-07-29 ENCOUNTER — Other Ambulatory Visit: Payer: Self-pay

## 2023-08-12 ENCOUNTER — Other Ambulatory Visit: Payer: Self-pay | Admitting: Internal Medicine

## 2023-08-12 DIAGNOSIS — Z1231 Encounter for screening mammogram for malignant neoplasm of breast: Secondary | ICD-10-CM

## 2023-08-22 ENCOUNTER — Ambulatory Visit
Admission: RE | Admit: 2023-08-22 | Discharge: 2023-08-22 | Disposition: A | Discharge status: Home / Self Care | Payer: Managed Care, Other (non HMO) | Source: Ambulatory Visit | Attending: Internal Medicine | Admitting: Internal Medicine

## 2023-08-22 DIAGNOSIS — Z1231 Encounter for screening mammogram for malignant neoplasm of breast: Secondary | ICD-10-CM | POA: Diagnosis present

## 2023-08-26 ENCOUNTER — Other Ambulatory Visit: Payer: Self-pay

## 2023-08-26 ENCOUNTER — Other Ambulatory Visit: Payer: Self-pay | Admitting: Dermatology

## 2023-08-26 MED ORDER — ALPRAZOLAM 0.25 MG PO TABS
0.2500 mg | ORAL_TABLET | Freq: Two times a day (BID) | ORAL | 5 refills | Status: AC | PRN
  Filled 2023-08-26: qty 30, 15d supply, fill #0
  Filled 2023-11-25: qty 30, 15d supply, fill #1
  Filled 2024-01-30: qty 30, 15d supply, fill #2

## 2023-08-26 MED ORDER — AMPHETAMINE-DEXTROAMPHETAMINE 10 MG PO TABS
10.0000 mg | ORAL_TABLET | Freq: Two times a day (BID) | ORAL | 0 refills | Status: DC
  Filled 2023-08-26: qty 60, 30d supply, fill #0

## 2023-08-26 MED ORDER — DOXYCYCLINE MONOHYDRATE 50 MG PO CAPS
50.0000 mg | ORAL_CAPSULE | Freq: Every evening | ORAL | 0 refills | Status: DC
  Filled 2023-08-26: qty 30, 30d supply, fill #0

## 2023-08-26 NOTE — Progress Notes (Signed)
Patient 1 year appt is 09/11/2023

## 2023-08-27 ENCOUNTER — Other Ambulatory Visit: Payer: Self-pay

## 2023-09-11 ENCOUNTER — Ambulatory Visit (INDEPENDENT_AMBULATORY_CARE_PROVIDER_SITE_OTHER): Payer: Managed Care, Other (non HMO) | Admitting: Dermatology

## 2023-09-11 ENCOUNTER — Encounter: Payer: Self-pay | Admitting: Dermatology

## 2023-09-11 VITALS — BP 139/84 | HR 75

## 2023-09-11 DIAGNOSIS — L700 Acne vulgaris: Secondary | ICD-10-CM | POA: Diagnosis not present

## 2023-09-11 DIAGNOSIS — L719 Rosacea, unspecified: Secondary | ICD-10-CM

## 2023-09-11 DIAGNOSIS — Z7189 Other specified counseling: Secondary | ICD-10-CM

## 2023-09-11 DIAGNOSIS — L578 Other skin changes due to chronic exposure to nonionizing radiation: Secondary | ICD-10-CM

## 2023-09-11 DIAGNOSIS — Z1283 Encounter for screening for malignant neoplasm of skin: Secondary | ICD-10-CM

## 2023-09-11 DIAGNOSIS — L814 Other melanin hyperpigmentation: Secondary | ICD-10-CM

## 2023-09-11 DIAGNOSIS — D1801 Hemangioma of skin and subcutaneous tissue: Secondary | ICD-10-CM

## 2023-09-11 DIAGNOSIS — W908XXA Exposure to other nonionizing radiation, initial encounter: Secondary | ICD-10-CM

## 2023-09-11 DIAGNOSIS — Z86018 Personal history of other benign neoplasm: Secondary | ICD-10-CM

## 2023-09-11 DIAGNOSIS — D229 Melanocytic nevi, unspecified: Secondary | ICD-10-CM

## 2023-09-11 DIAGNOSIS — Z79899 Other long term (current) drug therapy: Secondary | ICD-10-CM

## 2023-09-11 DIAGNOSIS — L821 Other seborrheic keratosis: Secondary | ICD-10-CM

## 2023-09-11 NOTE — Patient Instructions (Addendum)
For Rosacea   Continue Doxycycline 50 mg - take 1 by mouth daily with evening meal with food  3 month supply   Doxycycline should be taken with food to prevent nausea. Do not lay down for 30 minutes after taking. Be cautious with sun exposure and use good sun protection while on this medication. Pregnant women should not take this medication.    Melanoma ABCDEs  Melanoma is the most dangerous type of skin cancer, and is the leading cause of death from skin disease.  You are more likely to develop melanoma if you: Have light-colored skin, light-colored eyes, or red or blond hair Spend a lot of time in the sun Tan regularly, either outdoors or in a tanning bed Have had blistering sunburns, especially during childhood Have a close family member who has had a melanoma Have atypical moles or large birthmarks  Early detection of melanoma is key since treatment is typically straightforward and cure rates are extremely high if we catch it early.   The first sign of melanoma is often a change in a mole or a new dark spot.  The ABCDE system is a way of remembering the signs of melanoma.  A for asymmetry:  The two halves do not match. B for border:  The edges of the growth are irregular. C for color:  A mixture of colors are present instead of an even brown color. D for diameter:  Melanomas are usually (but not always) greater than 6mm - the size of a pencil eraser. E for evolution:  The spot keeps changing in size, shape, and color.  Please check your skin once per month between visits. You can use a small mirror in front and a large mirror behind you to keep an eye on the back side or your body.   If you see any new or changing lesions before your next follow-up, please call to schedule a visit.  Please continue daily skin protection including broad spectrum sunscreen SPF 30+ to sun-exposed areas, reapplying every 2 hours as needed when you're outdoors.   Staying in the shade or wearing  long sleeves, sun glasses (UVA+UVB protection) and wide brim hats (4-inch brim around the entire circumference of the hat) are also recommended for sun protection.    Due to recent changes in healthcare laws, you may see results of your pathology and/or laboratory studies on MyChart before the doctors have had a chance to review them. We understand that in some cases there may be results that are confusing or concerning to you. Please understand that not all results are received at the same time and often the doctors may need to interpret multiple results in order to provide you with the best plan of care or course of treatment. Therefore, we ask that you please give [place] 2 business days to thoroughly review all your results before contacting the office for clarification. Should we see a critical lab result, you will be contacted sooner.   If You Need Anything After Your Visit  If you have any questions or concerns for your doctor, please call our main line at [phone] and press option 4 to reach your doctor's medical assistant. If no one answers, please leave a voicemail as directed and we will return your call as soon as possible. Messages left after 4 pm will be answered the following business day.   You may also send [place] a message via MyChart. We typically respond to MyChart messages within 1-2 business days.  For prescription refills, please ask your pharmacy to contact our office. Our fax number is [phone].  If you have an urgent issue when the clinic is closed that cannot wait until the next business day, you can page your doctor at the number below.    Please note that while we do our best to be available for urgent issues outside of office hours, we are not available 24/7.   If you have an urgent issue and are unable to reach [place], you may choose to seek medical care at your doctor's office, retail clinic, urgent care center, or emergency room.  If you have a medical emergency, please  immediately call 911 or go to the emergency department.  Pager Numbers  - Dr. Gwen Pounds: [phone]  - Dr. Roseanne Reno: [phone]  - Dr. Katrinka Blazing: [phone]   In the event of inclement weather, please call our main line at [phone] for an update on the status of any delays or closures.  Dermatology Medication Tips: Please keep the boxes that topical medications come in in order to help keep track of the instructions about where and how to use these. Pharmacies typically print the medication instructions only on the boxes and not directly on the medication tubes.   If your medication is too expensive, please contact our office at [phone] option 4 or send [place] a message through MyChart.   We are unable to tell what your co-pay for medications will be in advance as this is different depending on your insurance coverage. However, we may be able to find a substitute medication at lower cost or fill out paperwork to get insurance to cover a needed medication.   If a prior authorization is required to get your medication covered by your insurance company, please allow [place] 1-2 business days to complete this process.  Drug prices often vary depending on where the prescription is filled and some pharmacies may offer cheaper prices.  The website www.goodrx.com contains coupons for medications through different pharmacies. The prices here do not account for what the cost may be with help from insurance (it may be cheaper with your insurance), but the website can give you the price if you did not use any insurance.  - You can print the associated coupon and take it with your prescription to the pharmacy.  - You may also stop by our office during regular business hours and pick up a GoodRx coupon card.  - If you need your prescription sent electronically to a different pharmacy, notify our office through [facility] or by phone at [phone] option 4.     Si Usted Necesita Algo  Despus de Su Visita  Tambin puede enviarnos un mensaje a travs de Clinical cytogeneticist. Por lo general respondemos a los mensajes de MyChart en el transcurso de 1 a 2 das hbiles.  Para renovar recetas, por favor pida a su farmacia que se ponga en contacto con nuestra oficina. Annie Sable de fax es [place] [phone].  Si tiene un asunto urgente cuando la clnica est cerrada y que no puede esperar hasta el siguiente da hbil, puede llamar/localizar a su doctor(a) al nmero que aparece a continuacin.   Por favor, tenga en cuenta que aunque hacemos todo lo posible para estar disponibles para asuntos urgentes fuera del horario de [place], no estamos disponibles las 24 horas del da, los 7 [address]  [address] de la [place].   Si tiene un problema urgente y no puede comunicarse con nosotros, puede optar por buscar atencin mdica  en el consultorio de su doctor(a), en una clnica privada, en un centro de atencin urgente o en una sala de emergencias.  Si tiene Engineer, drilling, por favor llame inmediatamente al 911 o vaya a la sala de emergencias.  Nmeros de bper  - Dr. Gwen Pounds: [phone]  - Dra. Roseanne Reno: [phone]  - Dr. Katrinka Blazing: [phone]   En caso de inclemencias del tiempo, por favor llame a Lacy Duverney principal al [phone] para una actualizacin sobre el [place] de cualquier retraso o cierre.  Consejos para la medicacin en dermatologa: Por favor, guarde las cajas en las que vienen los medicamentos de uso tpico para ayudarle a seguir las instrucciones sobre dnde y cmo usarlos. Las farmacias generalmente imprimen las instrucciones del medicamento slo en las cajas y no directamente en los tubos del [place].   Si su medicamento es muy caro, por favor, pngase en contacto con Rolm Gala llamando al [phone] y presione la opcin 4 o envenos un mensaje a travs de Clinical cytogeneticist.   No podemos decirle cul ser su copago por los medicamentos por adelantado ya que esto es diferente  dependiendo de la cobertura de su seguro. Sin embargo, es posible que podamos encontrar un medicamento sustituto a Audiological scientist un formulario para que el seguro cubra el medicamento que se considera necesario.   Si se requiere una autorizacin previa para que su compaa de seguros [place] su medicamento, por favor permtanos de 1 a 2 das hbiles para completar [address]  Los precios de los medicamentos varan con frecuencia dependiendo del Environmental consultant de dnde se surte la receta y alguna farmacias pueden ofrecer precios ms baratos.  El sitio web www.goodrx.com tiene cupones para medicamentos de Health and safety inspector. Los precios aqu no tienen en cuenta lo que podra costar con la ayuda del seguro (puede ser ms barato con su seguro), pero el sitio web puede darle el precio si no utiliz Tourist information centre manager.  - Puede imprimir el cupn correspondiente y llevarlo con su receta a la farmacia.  - Tambin puede pasar por nuestra oficina durante el horario de atencin regular y Education officer, museum una tarjeta de cupones de GoodRx.  - Si necesita que su receta se enve electrnicamente a una farmacia diferente, informe a nuestra oficina a travs de MyChart de [place] o por telfono llamando al [phone] y presione la opcin 4.

## 2023-09-11 NOTE — Progress Notes (Signed)
Follow-Up Visit   Subjective  Vanessa Kim is a 47 y.o. female who presents for the following: Skin Cancer Screening and Full Body Skin Exam Hx of dysplastic nevi, hx of rosacea, hx of acne  The patient presents for Total-Body Skin Exam (TBSE) for skin cancer screening and mole check. The patient has spots, moles and lesions to be evaluated, some may be new or changing and the patient may have concern these could be cancer.    The following portions of the chart were reviewed this encounter and updated as appropriate: medications, allergies, medical history  Review of Systems:  No other skin or systemic complaints except as noted in HPI or Assessment and Plan.  Objective  Well appearing patient in no apparent distress; mood and affect are within normal limits.  A full examination was performed including scalp, head, eyes, ears, nose, lips, neck, chest, axillae, abdomen, back, buttocks, bilateral upper extremities, bilateral lower extremities, hands, feet, fingers, toes, fingernails, and toenails. All findings within normal limits unless otherwise noted below.   Relevant physical exam findings are noted in the Assessment and Plan.    Assessment & Plan   ROSACEA Exam Clear at exam Chronic condition with duration or expected duration over one year. Currently well-controlled. Rosacea is a chronic progressive skin condition usually affecting the face of adults, causing redness and/or acne bumps. It is treatable but not curable. It sometimes affects the eyes (ocular rosacea) as well. It may respond to topical and/or systemic medication and can flare with stress, sun exposure, alcohol, exercise, topical steroids (including hydrocortisone/cortisone 10) and some foods.  Daily application of broad spectrum spf 30+ sunscreen to face is recommended to reduce flares.  Patient denies grittiness of the eyes Treatment Plan Continue doxycycline 50mg  take 1 tab po qd with evening meal.  90 day  supply yr rf  Patient request rx be sent to [facility]  Doxycycline should be taken with food to prevent nausea. Do not lay down for 30 minutes after taking. Be cautious with sun exposure and use good sun protection while on this medication. Pregnant women should not take this medication.  Long term medication management.  Patient is using long term (months to years) prescription medication  to control their dermatologic condition.  These medications require periodic monitoring to evaluate for efficacy and side effects and may require periodic laboratory monitoring.  ACNE VULGARIS Exam: Clear at exam  Chronic condition with duration or expected duration over one year. Currently well-controlled. Treatment Plan:  Continue Tretinoin 0.025% cream apply to face at bedtime  Patient request refills be sent to [facility]    Topical retinoid medications like tretinoin/Retin-A, adapalene/Differin, tazarotene/Fabior, and Epiduo/Epiduo Forte can cause dryness and irritation when first started. Only apply a pea-sized amount to the entire affected area. Avoid applying it around the eyes, edges of mouth and creases at the nose. If you experience irritation, use a good moisturizer first and/or apply the medicine less often. If you are doing well with the medicine, you can increase how often you use it until you are applying every night. Be careful with sun protection while using this medication as it can make you sensitive to the sun. This medicine should not be used by pregnant women.    Long term medication management.  Patient is using long term (months to years) prescription medication  to control their dermatologic condition.  These medications require periodic monitoring to evaluate for efficacy and side effects and may require periodic  laboratory monitoring.   SKIN CANCER SCREENING PERFORMED TODAY.  ACTINIC DAMAGE - Chronic condition, secondary to cumulative UV/sun exposure - diffuse  scaly erythematous macules with underlying dyspigmentation - Recommend daily broad spectrum sunscreen SPF 30+ to sun-exposed areas, reapply every 2 hours as needed.  - Staying in the shade or wearing long sleeves, sun glasses (UVA+UVB protection) and wide brim hats (4-inch brim around the entire circumference of the hat) are also recommended for sun protection.  - Call for new or changing lesions.  LENTIGINES, SEBORRHEIC KERATOSES, HEMANGIOMAS - Benign normal skin lesions - Benign-appearing - Call for any changes  MELANOCYTIC NEVI - Tan-brown and/or pink-flesh-colored symmetric macules and papules - Benign appearing on exam today - Observation - Call clinic for new or changing moles - Recommend daily use of broad spectrum spf 30+ sunscreen to sun-exposed areas.   HISTORY OF DYSPLASTIC NEVUS Multiple locations No evidence of recurrence today Recommend regular full body skin exams Recommend daily broad spectrum sunscreen SPF 30+ to sun-exposed areas, reapply every 2 hours as needed.  Call if any new or changing lesions are noted between office visits   Return in about 1 year (around 09/10/2024) for TBSE.  IAsher Muir, CMA, am acting as scribe for Armida Sans, MD.   Documentation: I have reviewed the above documentation for accuracy and completeness, and I agree with the above.  Armida Sans, MD

## 2023-09-14 ENCOUNTER — Encounter: Payer: Self-pay | Admitting: Dermatology

## 2023-09-18 ENCOUNTER — Encounter: Payer: Self-pay | Admitting: Dermatology

## 2023-09-18 DIAGNOSIS — L700 Acne vulgaris: Secondary | ICD-10-CM

## 2023-09-18 DIAGNOSIS — L719 Rosacea, unspecified: Secondary | ICD-10-CM

## 2023-09-18 MED ORDER — TRETINOIN 0.025 % EX CREA: TOPICAL_CREAM | Freq: Every day | CUTANEOUS | 11 refills | Status: AC

## 2023-09-18 MED ORDER — DOXYCYCLINE MONOHYDRATE 50 MG PO CAPS: 50.0000 mg | ORAL_CAPSULE | Freq: Every evening | ORAL | 3 refills | Status: DC

## 2023-09-20 ENCOUNTER — Other Ambulatory Visit: Payer: Self-pay

## 2023-09-20 MED ORDER — MONTELUKAST SODIUM 10 MG PO TABS
10.0000 mg | ORAL_TABLET | Freq: Every day | ORAL | 3 refills | Status: DC
  Filled 2023-09-20 – 2024-09-09 (×4): qty 90, 90d supply, fill #0

## 2023-09-20 MED ORDER — LEVOCETIRIZINE DIHYDROCHLORIDE 5 MG PO TABS
5.0000 mg | ORAL_TABLET | Freq: Every evening | ORAL | 3 refills | Status: AC
  Filled 2023-09-20 – 2024-07-21 (×2): qty 90, 90d supply, fill #0

## 2023-09-23 ENCOUNTER — Other Ambulatory Visit: Payer: Self-pay

## 2023-09-23 MED ORDER — ALPRAZOLAM 0.25 MG PO TABS
0.2500 mg | ORAL_TABLET | Freq: Two times a day (BID) | ORAL | 5 refills | Status: DC | PRN
  Filled 2023-09-23: qty 30, 15d supply, fill #0
  Filled 2023-10-21: qty 30, 15d supply, fill #1
  Filled 2023-12-30: qty 30, 15d supply, fill #2
  Filled 2024-03-03: qty 30, 15d supply, fill #3

## 2023-09-23 MED ORDER — AMPHETAMINE-DEXTROAMPHETAMINE 10 MG PO TABS
10.0000 mg | ORAL_TABLET | Freq: Two times a day (BID) | ORAL | 0 refills | Status: DC
  Filled 2023-09-23: qty 60, 30d supply, fill #0

## 2023-09-27 ENCOUNTER — Other Ambulatory Visit: Payer: Self-pay

## 2023-10-21 ENCOUNTER — Other Ambulatory Visit: Payer: Self-pay

## 2023-10-22 ENCOUNTER — Other Ambulatory Visit: Payer: Self-pay

## 2023-10-22 MED ORDER — AMPHETAMINE-DEXTROAMPHETAMINE 10 MG PO TABS
10.0000 mg | ORAL_TABLET | Freq: Two times a day (BID) | ORAL | 0 refills | Status: DC
  Filled 2023-10-22: qty 60, 30d supply, fill #0

## 2023-11-25 ENCOUNTER — Other Ambulatory Visit: Payer: Self-pay

## 2023-11-26 ENCOUNTER — Other Ambulatory Visit: Payer: Self-pay

## 2023-11-26 MED ORDER — AMPHETAMINE-DEXTROAMPHETAMINE 10 MG PO TABS
10.0000 mg | ORAL_TABLET | Freq: Two times a day (BID) | ORAL | 0 refills | Status: DC
  Filled 2023-11-26: qty 60, 30d supply, fill #0

## 2023-12-30 ENCOUNTER — Other Ambulatory Visit: Payer: Self-pay

## 2023-12-31 ENCOUNTER — Other Ambulatory Visit: Payer: Self-pay

## 2023-12-31 MED ORDER — AMPHETAMINE-DEXTROAMPHETAMINE 10 MG PO TABS
10.0000 mg | ORAL_TABLET | Freq: Two times a day (BID) | ORAL | 0 refills | Status: DC
  Filled 2023-12-31: qty 60, 30d supply, fill #0

## 2024-01-03 ENCOUNTER — Other Ambulatory Visit: Payer: Self-pay

## 2024-01-30 ENCOUNTER — Other Ambulatory Visit: Payer: Self-pay

## 2024-01-30 MED ORDER — AMPHETAMINE-DEXTROAMPHETAMINE 10 MG PO TABS
10.0000 mg | ORAL_TABLET | Freq: Two times a day (BID) | ORAL | 0 refills | Status: DC
  Filled 2024-01-30 – 2024-01-31 (×2): qty 60, 30d supply, fill #0

## 2024-01-31 ENCOUNTER — Other Ambulatory Visit: Payer: Self-pay

## 2024-03-03 ENCOUNTER — Other Ambulatory Visit: Payer: Self-pay

## 2024-03-04 ENCOUNTER — Other Ambulatory Visit: Payer: Self-pay

## 2024-03-04 MED ORDER — AMPHETAMINE-DEXTROAMPHETAMINE 10 MG PO TABS
10.0000 mg | ORAL_TABLET | Freq: Two times a day (BID) | ORAL | 0 refills | Status: DC
  Filled 2024-03-04: qty 60, 30d supply, fill #0

## 2024-03-25 ENCOUNTER — Other Ambulatory Visit: Payer: Self-pay

## 2024-03-26 ENCOUNTER — Other Ambulatory Visit: Payer: Self-pay

## 2024-03-26 MED ORDER — ALPRAZOLAM 0.25 MG PO TABS
0.2500 mg | ORAL_TABLET | Freq: Two times a day (BID) | ORAL | 5 refills | Status: AC | PRN
  Filled 2024-03-26: qty 30, 15d supply, fill #0
  Filled 2024-04-30: qty 30, 15d supply, fill #1
  Filled 2024-05-21: qty 30, 15d supply, fill #2
  Filled 2024-07-21: qty 30, 15d supply, fill #3

## 2024-04-07 ENCOUNTER — Other Ambulatory Visit: Payer: Self-pay

## 2024-04-07 MED ORDER — OXYCODONE-ACETAMINOPHEN 5-325 MG PO TABS
1.0000 | ORAL_TABLET | Freq: Four times a day (QID) | ORAL | 0 refills | Status: DC | PRN
Start: 2024-04-07 — End: 2024-06-10
  Filled 2024-04-07: qty 10, 2d supply, fill #0

## 2024-04-30 ENCOUNTER — Other Ambulatory Visit: Payer: Self-pay

## 2024-04-30 MED ORDER — AMPHETAMINE-DEXTROAMPHETAMINE 10 MG PO TABS
10.0000 mg | ORAL_TABLET | Freq: Two times a day (BID) | ORAL | 0 refills | Status: DC
Start: 2024-04-30 — End: 2024-05-22
  Filled 2024-04-30: qty 60, 30d supply, fill #0

## 2024-05-08 ENCOUNTER — Other Ambulatory Visit: Payer: Self-pay

## 2024-05-08 ENCOUNTER — Other Ambulatory Visit (HOSPITAL_COMMUNITY): Payer: Self-pay

## 2024-05-08 MED ORDER — AMOXICILLIN-POT CLAVULANATE 875-125 MG PO TABS
1.0000 | ORAL_TABLET | Freq: Two times a day (BID) | ORAL | 0 refills | Status: DC
  Filled 2024-05-08: qty 20, 10d supply, fill #0

## 2024-05-21 ENCOUNTER — Other Ambulatory Visit: Payer: Self-pay

## 2024-05-22 ENCOUNTER — Other Ambulatory Visit: Payer: Self-pay

## 2024-05-22 MED ORDER — AMPHETAMINE-DEXTROAMPHETAMINE 10 MG PO TABS
10.0000 mg | ORAL_TABLET | Freq: Two times a day (BID) | ORAL | 0 refills | Status: DC
Start: 2024-05-22 — End: 2024-07-22
  Filled 2024-05-29: qty 60, 30d supply, fill #0

## 2024-05-29 ENCOUNTER — Other Ambulatory Visit: Payer: Self-pay

## 2024-06-01 ENCOUNTER — Other Ambulatory Visit: Payer: Self-pay

## 2024-06-10 ENCOUNTER — Other Ambulatory Visit: Payer: Self-pay

## 2024-06-10 ENCOUNTER — Other Ambulatory Visit: Payer: Self-pay | Admitting: Internal Medicine

## 2024-06-10 DIAGNOSIS — Z1231 Encounter for screening mammogram for malignant neoplasm of breast: Secondary | ICD-10-CM

## 2024-06-10 MED ORDER — ESTRADIOL 0.0375 MG/24HR TD PTWK
0.0375 mg | MEDICATED_PATCH | TRANSDERMAL | 11 refills | Status: DC
  Filled 2024-06-10: qty 4, 28d supply, fill #0
  Filled 2024-07-21: qty 4, 28d supply, fill #1

## 2024-07-09 ENCOUNTER — Other Ambulatory Visit: Payer: Self-pay

## 2024-07-09 MED ORDER — ESTRADIOL 0.5 MG PO TABS
0.5000 mg | ORAL_TABLET | Freq: Every day | ORAL | 3 refills | Status: DC
  Filled 2024-07-09 – 2024-07-21 (×2): qty 90, 90d supply, fill #0
  Filled 2024-09-01 – 2024-09-09 (×2): qty 90, 90d supply, fill #1

## 2024-07-21 ENCOUNTER — Other Ambulatory Visit: Payer: Self-pay

## 2024-07-22 ENCOUNTER — Other Ambulatory Visit: Payer: Self-pay

## 2024-07-22 MED ORDER — AMPHETAMINE-DEXTROAMPHETAMINE 10 MG PO TABS
10.0000 mg | ORAL_TABLET | Freq: Two times a day (BID) | ORAL | 0 refills | Status: DC
  Filled 2024-07-22: qty 60, 30d supply, fill #0

## 2024-07-22 MED ORDER — ALPRAZOLAM 0.25 MG PO TABS
0.2500 mg | ORAL_TABLET | Freq: Two times a day (BID) | ORAL | 5 refills | Status: DC | PRN
  Filled 2024-07-22 – 2024-09-01 (×2): qty 30, 15d supply, fill #0
  Filled 2024-10-09: qty 30, 15d supply, fill #1
  Filled 2024-11-12: qty 30, 15d supply, fill #2
  Filled 2024-12-16: qty 30, 15d supply, fill #3

## 2024-07-24 ENCOUNTER — Other Ambulatory Visit: Payer: Self-pay

## 2024-09-01 ENCOUNTER — Other Ambulatory Visit: Payer: Self-pay

## 2024-09-01 MED ORDER — PREDNISONE 20 MG PO TABS
40.0000 mg | ORAL_TABLET | Freq: Every day | ORAL | 0 refills | Status: DC
  Filled 2024-09-01: qty 10, 5d supply, fill #0

## 2024-09-09 ENCOUNTER — Other Ambulatory Visit: Payer: Self-pay

## 2024-09-09 MED ORDER — AMPHETAMINE-DEXTROAMPHETAMINE 10 MG PO TABS
10.0000 mg | ORAL_TABLET | Freq: Two times a day (BID) | ORAL | 0 refills | Status: DC
  Filled 2024-09-09 (×2): qty 60, 30d supply, fill #0

## 2024-09-16 ENCOUNTER — Ambulatory Visit: Payer: Managed Care, Other (non HMO) | Admitting: Dermatology

## 2024-09-23 ENCOUNTER — Other Ambulatory Visit: Payer: Self-pay

## 2024-09-23 MED ORDER — TRAMADOL HCL 50 MG PO TABS
50.0000 mg | ORAL_TABLET | Freq: Four times a day (QID) | ORAL | 0 refills | Status: DC | PRN
  Filled 2024-09-23: qty 20, 5d supply, fill #0

## 2024-09-23 MED ORDER — DICLOFENAC SODIUM 75 MG PO TBEC
75.0000 mg | DELAYED_RELEASE_TABLET | Freq: Two times a day (BID) | ORAL | 0 refills | Status: DC
  Filled 2024-09-23: qty 30, 15d supply, fill #0

## 2024-09-29 ENCOUNTER — Other Ambulatory Visit: Payer: Self-pay

## 2024-09-29 MED ORDER — BACLOFEN 10 MG PO TABS
10.0000 mg | ORAL_TABLET | Freq: Two times a day (BID) | ORAL | 0 refills | Status: AC
  Filled 2024-09-29: qty 30, 15d supply, fill #0

## 2024-10-02 ENCOUNTER — Other Ambulatory Visit: Payer: Self-pay

## 2024-10-02 MED ORDER — ESTRADIOL 0.05 MG/24HR TD PTWK
0.0500 mg | MEDICATED_PATCH | TRANSDERMAL | 11 refills | Status: DC
  Filled 2024-10-02: qty 4, 28d supply, fill #0
  Filled 2024-10-26: qty 4, 28d supply, fill #1
  Filled 2024-11-16: qty 4, 28d supply, fill #2

## 2024-10-09 ENCOUNTER — Other Ambulatory Visit: Payer: Self-pay

## 2024-10-13 ENCOUNTER — Other Ambulatory Visit: Payer: Self-pay

## 2024-10-13 MED ORDER — AMPHETAMINE-DEXTROAMPHETAMINE 10 MG PO TABS
10.0000 mg | ORAL_TABLET | Freq: Two times a day (BID) | ORAL | 0 refills | Status: DC
  Filled 2024-10-13: qty 60, 30d supply, fill #0

## 2024-10-13 MED ORDER — METHOCARBAMOL 500 MG PO TABS
500.0000 mg | ORAL_TABLET | Freq: Three times a day (TID) | ORAL | 0 refills | Status: DC | PRN
  Filled 2024-10-13: qty 40, 14d supply, fill #0

## 2024-10-26 ENCOUNTER — Other Ambulatory Visit: Payer: Self-pay

## 2024-10-26 MED ORDER — ESTRADIOL 0.05 MG/24HR TD PTWK
0.0500 mg | MEDICATED_PATCH | TRANSDERMAL | 11 refills | Status: DC
  Filled 2024-10-26 – 2024-12-09 (×2): qty 4, 28d supply, fill #0

## 2024-11-12 ENCOUNTER — Other Ambulatory Visit: Payer: Self-pay

## 2024-11-13 ENCOUNTER — Other Ambulatory Visit: Payer: Self-pay

## 2024-11-13 MED ORDER — AMPHETAMINE-DEXTROAMPHETAMINE 10 MG PO TABS
10.0000 mg | ORAL_TABLET | Freq: Two times a day (BID) | ORAL | 0 refills | Status: DC
  Filled 2024-11-13: qty 60, 30d supply, fill #0

## 2024-11-13 MED ORDER — LEVOCETIRIZINE DIHYDROCHLORIDE 5 MG PO TABS
5.0000 mg | ORAL_TABLET | Freq: Every evening | ORAL | 3 refills | Status: AC
  Filled 2024-11-13: qty 30, 30d supply, fill #0
  Filled 2024-12-09: qty 30, 30d supply, fill #1
  Filled 2025-01-20: qty 30, 30d supply, fill #2

## 2024-11-30 ENCOUNTER — Ambulatory Visit
Admission: RE | Admit: 2024-11-30 | Discharge: 2024-11-30 | Disposition: A | Source: Ambulatory Visit | Attending: Internal Medicine | Admitting: Internal Medicine

## 2024-11-30 DIAGNOSIS — Z1231 Encounter for screening mammogram for malignant neoplasm of breast: Secondary | ICD-10-CM | POA: Diagnosis present

## 2024-12-07 ENCOUNTER — Other Ambulatory Visit
Admission: RE | Admit: 2024-12-07 | Discharge: 2024-12-07 | Disposition: A | Source: Ambulatory Visit | Attending: Internal Medicine | Admitting: Internal Medicine

## 2024-12-07 ENCOUNTER — Other Ambulatory Visit: Payer: Self-pay | Admitting: Internal Medicine

## 2024-12-07 ENCOUNTER — Other Ambulatory Visit: Payer: Self-pay

## 2024-12-07 DIAGNOSIS — R7989 Other specified abnormal findings of blood chemistry: Secondary | ICD-10-CM

## 2024-12-07 LAB — D-DIMER, QUANTITATIVE: D-Dimer, Quant: 0.28 ug{FEU}/mL (ref 0.00–0.50)

## 2024-12-07 LAB — TROPONIN T, HIGH SENSITIVITY: Troponin T High Sensitivity: <15 ng/L (ref 0–19)

## 2024-12-07 MED ORDER — NITROGLYCERIN 0.3 MG SL SUBL
0.3000 mg | SUBLINGUAL_TABLET | SUBLINGUAL | 11 refills | Status: AC
  Filled 2024-12-07: qty 25, 8d supply, fill #0
  Filled 2024-12-08: qty 100, 90d supply, fill #0

## 2024-12-08 ENCOUNTER — Ambulatory Visit: Admission: RE | Admit: 2024-12-08 | Discharge: 2024-12-08 | Attending: Internal Medicine | Admitting: Internal Medicine

## 2024-12-08 ENCOUNTER — Other Ambulatory Visit: Payer: Self-pay

## 2024-12-08 DIAGNOSIS — R7989 Other specified abnormal findings of blood chemistry: Secondary | ICD-10-CM

## 2024-12-09 ENCOUNTER — Other Ambulatory Visit: Payer: Self-pay

## 2024-12-09 ENCOUNTER — Ambulatory Visit: Payer: Self-pay | Admitting: Surgery

## 2024-12-09 MED ORDER — INDOCYANINE GREEN 25 MG IJ SOLR
1.2500 mg | Freq: Once | INTRAMUSCULAR | Status: AC
  Administered 2024-12-10: 1.25 mg via INTRAVENOUS

## 2024-12-09 MED ORDER — CEFAZOLIN SODIUM-DEXTROSE 2-4 GM/100ML-% IV SOLN: 2.0000 g | INTRAVENOUS | Status: DC

## 2024-12-09 NOTE — H&P (View-Only) (Signed)
 Subjective:   CC: Elevated LFTs [R79.89]  HPI:  referred by Layman Tanda Crisp* for evaluation of above CC.   History of Present Illness Vanessa Kim is a 48 year old female with gallbladder disease and abnormal liver function who presents with acute right upper quadrant abdominal pain.  She awoke this morning with a dull, steady right upper quadrant ache that worsened throughout the day, with significant nausea, dizziness, fatigue, and decreased appetite leading to reduced oral intake.  She notes less frequent bowel movements and mild lower abdominal and epigastric discomfort, without dysuria, urinary retention, or other urinary changes.  Approximately 12 days ago after Thanksgiving, she developed recurrent nonburning chest pain on most days, sometimes worsened by deep inspiration, including a four-hour episode of severe chest pain with blood pressure to 179/90s that resolved without emergency evaluation.  She has a strong family history of gallbladder removal in both parents and her sister, and prior laparoscopic hysterectomy and surgery for endometriosis at age 69.    Past Medical History:  has a past medical history of Allergic state, Anemia, Anxiety, Arthritis, Depression, Endometriosis, GERD (gastroesophageal reflux disease) (2019), Gestational diabetes mellitus in pregnancy ([facility]-HCC), Migraine headache, and Sleep apnea (08/29/2018).  Past Surgical History:  has a past surgical history that includes laparoscopy diagnostic; Hysterectomy (2016); Colonoscopy (10/26/2020); Tubal ligation (07/06/2010); and Bunionectomy (Right).  Family History: family history includes Anxiety in her father, mother, sister, and son; Breast cancer in her sister; Depression in her father, mother, and sister; Diabetes type II in her father and mother; High blood pressure (Hypertension) in her father and mother; Skin cancer in her father.  Social History:  reports that she quit smoking about 20 years ago.  Her smoking use included cigarettes. She started smoking about 30 years ago. She has a 10 pack-year smoking history. She has never used smokeless tobacco. She reports current alcohol use of about 9.0 standard drinks of alcohol per week. She reports that she does not currently use drugs after having used the following drugs: Other-see comments.  Current Medications: has a current medication list which includes the following prescription(s): alprazolam , dextroamphetamine -amphetamine , doxycycline , estradiol , ibuprofen, levocetirizine, montelukast , and nitroglycerin .  Allergies:  Allergies as of 12/09/2024   (No Known Allergies)    ROS:  A 15 point review of systems was performed and pertinent positives and negatives noted in HPI    Objective:     There were no vitals taken for this visit.   Constitutional :  No distress, cooperative, alert  Lymphatics/Throat:  Supple with no lymphadenopathy  Respiratory:  Clear to auscultation bilaterally  Cardiovascular:  Regular rate and rhythm  Gastrointestinal: Soft, focal RUQ TTP, non-distended, no organomegaly.  Musculoskeletal: Steady gait and movement  Skin: Cool and moist  Psychiatric: Normal affect, non-agitated, not confused       LABS:  - Lab Results  Component Value Date   WBC 5.4 12/07/2024   HGB 13.6 12/07/2024   HCT 41.5 12/07/2024   PLT 273 12/07/2024   - Lab Results  Component Value Date   NA 139 12/07/2024   K 4.2 12/07/2024   CL 103 12/07/2024   CO2 27.6 12/07/2024   BUN 12 12/07/2024   CREATININE 0.9 12/07/2024   CALCIUM 9.6 12/07/2024   ALB 5.0 (H) 12/07/2024   TBILI 0.7 12/07/2024   ALKPHOS 239 (H) 12/07/2024   AST 321 (H) 12/07/2024   ALT 640 (H) 12/07/2024   GLUCOSE 106 12/07/2024   GFR 78 12/07/2024   -lipase WNL  RADS: EXAM:  Right Upper Quadrant Abdominal Ultrasound   TECHNIQUE:  Real-time ultrasonography of the right upper quadrant of the abdomen was  performed.   COMPARISON:  None  available.   CLINICAL HISTORY:  abnormal LFTS   FINDINGS:   LIVER:  The liver is mildly and diffusely echogenic. No intrahepatic biliary ductal  dilatation. No mass.   BILIARY SYSTEM:  Gallbladder wall thickness measures 2.9 mm. There are multiple gallstones  present. There is no definite evidence of cholecystitis. No evidence of  pericholecystic fluid. The common bile duct measures 2.1 mm.   OTHER:  No right upper quadrant ascites.   IMPRESSION:  1. Multiple gallstones in the gallbladder without definite evidence of  cholecystitis.  2. Mildly and diffusely echogenic liver, concerning for underlying steatosis.   Electronically signed by: Evalene Coho MD 12/08/2024 10:09 AM EST RP  Workstation: [id number]  Assessment:      Elevated LFTs [R79.89]- likely gb related despite US  above, due to history and pain location.  Plan:     1. Elevated LFTs [R79.89] Discussed the risk of surgery including post-op infxn, seroma, biloma, chronic pain, poor-delayed wound healing, retained gallstone, conversion to open procedure, post-op SBO or ileus, and need for additional procedures to address said risks.  The risks of general anesthetic including MI, CVA, sudden death or even reaction to anesthetic medications also discussed. Alternatives include continued observation.  Benefits include possible symptom relief, prevention of complications including acute cholecystitis, pancreatitis.  Typical post operative recovery of 3-5 days rest, continued pain in area and incision sites, possible loose stools up to 4-6 weeks, also discussed.  ED return precautions given for sudden increase in RUQ pain, with possible accompanying fever, nausea, and/or vomiting.  The patient understands the risks, any and all questions were answered to the patient's satisfaction.  2. Elevated LFTs [R79.89]. Likely chronic cholecystitis Will proceed with robotic assisted laparoscopic cholecystectomy  [phone]  labs/images/medications/previous chart entries reviewed personally and relevant changes/updates noted above.

## 2024-12-09 NOTE — Progress Notes (Signed)
 Subjective:   CC: Elevated LFTs [R79.89]  HPI:  referred by Layman Tanda Crisp* for evaluation of above CC.   History of Present Illness Vanessa Kim is a 48 year old female with gallbladder disease and abnormal liver function who presents with acute right upper quadrant abdominal pain.  She awoke this morning with a dull, steady right upper quadrant ache that worsened throughout the day, with significant nausea, dizziness, fatigue, and decreased appetite leading to reduced oral intake.  She notes less frequent bowel movements and mild lower abdominal and epigastric discomfort, without dysuria, urinary retention, or other urinary changes.  Approximately 12 days ago after Thanksgiving, she developed recurrent nonburning chest pain on most days, sometimes worsened by deep inspiration, including a four-hour episode of severe chest pain with blood pressure to 179/90s that resolved without emergency evaluation.  She has a strong family history of gallbladder removal in both parents and her sister, and prior laparoscopic hysterectomy and surgery for endometriosis at age 62.    Past Medical History:  has a past medical history of Allergic state, Anemia, Anxiety, Arthritis, Depression, Endometriosis, GERD (gastroesophageal reflux disease) (2019), Gestational diabetes mellitus in pregnancy ([facility]), Migraine headache, and Sleep apnea (08/29/2018).  Past Surgical History:  has a past surgical history that includes laparoscopy diagnostic; Hysterectomy (2016); Colonoscopy (10/26/2020); Tubal ligation (07/06/2010); and Bunionectomy (Right).  Family History: family history includes Anxiety in her father, mother, sister, and son; Breast cancer in her sister; Depression in her father, mother, and sister; Diabetes type II in her father and mother; High blood pressure (Hypertension) in her father and mother; Skin cancer in her father.  Social History:  reports that she quit smoking about 20 years ago.  Her smoking use included cigarettes. She started smoking about 30 years ago. She has a 10 pack-year smoking history. She has never used smokeless tobacco. She reports current alcohol use of about 9.0 standard drinks of alcohol per week. She reports that she does not currently use drugs after having used the following drugs: Other-see comments.  Current Medications: has a current medication list which includes the following prescription(s): alprazolam , dextroamphetamine -amphetamine , doxycycline , estradiol , ibuprofen, levocetirizine, montelukast , and nitroglycerin .  Allergies:  Allergies as of 12/09/2024   (No Known Allergies)    ROS:  A 15 point review of systems was performed and pertinent positives and negatives noted in HPI    Objective:     BP 135/84   Pulse 82   Ht 167.6 cm (5' 6)   Wt 69.9 kg (154 lb)   BMI 24.86 kg/m    Constitutional :  No distress, cooperative, alert  Lymphatics/Throat:  Supple with no lymphadenopathy  Respiratory:  Clear to auscultation bilaterally  Cardiovascular:  Regular rate and rhythm  Gastrointestinal: Soft, focal RUQ TTP, non-distended, no organomegaly.  Musculoskeletal: Steady gait and movement  Skin: Cool and moist  Psychiatric: Normal affect, non-agitated, not confused       LABS:  - Lab Results  Component Value Date   WBC 8.0 12/09/2024   HGB 13.3 12/09/2024   HCT 39.6 12/09/2024   PLT 261 12/09/2024   - Lab Results  Component Value Date   NA 141 12/09/2024   K 3.9 12/09/2024   CL 105 12/09/2024   CO2 29.8 12/09/2024   BUN 14 12/09/2024   CREATININE 0.8 12/09/2024   CALCIUM 9.4 12/09/2024   ALB 4.8 12/09/2024   TBILI 0.3 12/09/2024   ALKPHOS 149 (H) 12/09/2024   AST 43 (H) 12/09/2024   ALT 248 (H)  12/09/2024   GLUCOSE 129 (H) 12/09/2024   GFR 90 12/09/2024   -lipase WNL   RADS: EXAM:  Right Upper Quadrant Abdominal Ultrasound   TECHNIQUE:  Real-time ultrasonography of the right upper quadrant of the abdomen was   performed.   COMPARISON:  None available.   CLINICAL HISTORY:  abnormal LFTS   FINDINGS:   LIVER:  The liver is mildly and diffusely echogenic. No intrahepatic biliary ductal  dilatation. No mass.   BILIARY SYSTEM:  Gallbladder wall thickness measures 2.9 mm. There are multiple gallstones  present. There is no definite evidence of cholecystitis. No evidence of  pericholecystic fluid. The common bile duct measures 2.1 mm.   OTHER:  No right upper quadrant ascites.   IMPRESSION:  1. Multiple gallstones in the gallbladder without definite evidence of  cholecystitis.  2. Mildly and diffusely echogenic liver, concerning for underlying steatosis.   Electronically signed by: Evalene Coho MD 12/08/2024 10:09 AM EST RP  Workstation: [id number]  Assessment:      Elevated LFTs [R79.89]- likely gb related despite US  above, due to history and pain location.  Plan:     1. Elevated LFTs [R79.89] Discussed the risk of surgery including post-op infxn, seroma, biloma, chronic pain, poor-delayed wound healing, retained gallstone, conversion to open procedure, post-op SBO or ileus, and need for additional procedures to address said risks.  The risks of general anesthetic including MI, CVA, sudden death or even reaction to anesthetic medications also discussed. Alternatives include continued observation.  Benefits include possible symptom relief, prevention of complications including acute cholecystitis, pancreatitis.  Typical post operative recovery of 3-5 days rest, continued pain in area and incision sites, possible loose stools up to 4-6 weeks, also discussed.  ED return precautions given for sudden increase in RUQ pain, with possible accompanying fever, nausea, and/or vomiting.  The patient understands the risks, any and all questions were answered to the patient's satisfaction.  2. Elevated LFTs [R79.89]. Likely chronic cholecystitis Will proceed with robotic assisted  laparoscopic cholecystectomy [phone]  labs/images/medications/previous chart entries reviewed personally and relevant changes/updates noted above.

## 2024-12-09 NOTE — H&P (Signed)
 Subjective:   CC: Elevated LFTs [R79.89]  HPI:  referred by Layman Tanda Crisp* for evaluation of above CC.   History of Present Illness Vanessa Kim is a 48 year old female with gallbladder disease and abnormal liver function who presents with acute right upper quadrant abdominal pain.  She awoke this morning with a dull, steady right upper quadrant ache that worsened throughout the day, with significant nausea, dizziness, fatigue, and decreased appetite leading to reduced oral intake.  She notes less frequent bowel movements and mild lower abdominal and epigastric discomfort, without dysuria, urinary retention, or other urinary changes.  Approximately 12 days ago after Thanksgiving, she developed recurrent nonburning chest pain on most days, sometimes worsened by deep inspiration, including a four-hour episode of severe chest pain with blood pressure to 179/90s that resolved without emergency evaluation.  She has a strong family history of gallbladder removal in both parents and her sister, and prior laparoscopic hysterectomy and surgery for endometriosis at age 69.    Past Medical History:  has a past medical history of Allergic state, Anemia, Anxiety, Arthritis, Depression, Endometriosis, GERD (gastroesophageal reflux disease) (2019), Gestational diabetes mellitus in pregnancy ([facility]), Migraine headache, and Sleep apnea (08/29/2018).  Past Surgical History:  has a past surgical history that includes laparoscopy diagnostic; Hysterectomy (2016); Colonoscopy (10/26/2020); Tubal ligation (07/06/2010); and Bunionectomy (Right).  Family History: family history includes Anxiety in her father, mother, sister, and son; Breast cancer in her sister; Depression in her father, mother, and sister; Diabetes type II in her father and mother; High blood pressure (Hypertension) in her father and mother; Skin cancer in her father.  Social History:  reports that she quit smoking about 20 years ago.  Her smoking use included cigarettes. She started smoking about 30 years ago. She has a 10 pack-year smoking history. She has never used smokeless tobacco. She reports current alcohol use of about 9.0 standard drinks of alcohol per week. She reports that she does not currently use drugs after having used the following drugs: Other-see comments.  Current Medications: has a current medication list which includes the following prescription(s): alprazolam , dextroamphetamine -amphetamine , doxycycline , estradiol , ibuprofen, levocetirizine, montelukast , and nitroglycerin .  Allergies:  Allergies as of 12/09/2024   (No Known Allergies)    ROS:  A 15 point review of systems was performed and pertinent positives and negatives noted in HPI    Objective:     There were no vitals taken for this visit.   Constitutional :  No distress, cooperative, alert  Lymphatics/Throat:  Supple with no lymphadenopathy  Respiratory:  Clear to auscultation bilaterally  Cardiovascular:  Regular rate and rhythm  Gastrointestinal: Soft, focal RUQ TTP, non-distended, no organomegaly.  Musculoskeletal: Steady gait and movement  Skin: Cool and moist  Psychiatric: Normal affect, non-agitated, not confused       LABS:  - Lab Results  Component Value Date   WBC 5.4 12/07/2024   HGB 13.6 12/07/2024   HCT 41.5 12/07/2024   PLT 273 12/07/2024   - Lab Results  Component Value Date   NA 139 12/07/2024   K 4.2 12/07/2024   CL 103 12/07/2024   CO2 27.6 12/07/2024   BUN 12 12/07/2024   CREATININE 0.9 12/07/2024   CALCIUM 9.6 12/07/2024   ALB 5.0 (H) 12/07/2024   TBILI 0.7 12/07/2024   ALKPHOS 239 (H) 12/07/2024   AST 321 (H) 12/07/2024   ALT 640 (H) 12/07/2024   GLUCOSE 106 12/07/2024   GFR 78 12/07/2024   -lipase WNL  RADS: EXAM:  Right Upper Quadrant Abdominal Ultrasound   TECHNIQUE:  Real-time ultrasonography of the right upper quadrant of the abdomen was  performed.   COMPARISON:  None  available.   CLINICAL HISTORY:  abnormal LFTS   FINDINGS:   LIVER:  The liver is mildly and diffusely echogenic. No intrahepatic biliary ductal  dilatation. No mass.   BILIARY SYSTEM:  Gallbladder wall thickness measures 2.9 mm. There are multiple gallstones  present. There is no definite evidence of cholecystitis. No evidence of  pericholecystic fluid. The common bile duct measures 2.1 mm.   OTHER:  No right upper quadrant ascites.   IMPRESSION:  1. Multiple gallstones in the gallbladder without definite evidence of  cholecystitis.  2. Mildly and diffusely echogenic liver, concerning for underlying steatosis.   Electronically signed by: Evalene Coho MD 12/08/2024 10:09 AM EST RP  Workstation: [id number]  Assessment:      Elevated LFTs [R79.89]- likely gb related despite US  above, due to history and pain location.  Plan:     1. Elevated LFTs [R79.89] Discussed the risk of surgery including post-op infxn, seroma, biloma, chronic pain, poor-delayed wound healing, retained gallstone, conversion to open procedure, post-op SBO or ileus, and need for additional procedures to address said risks.  The risks of general anesthetic including MI, CVA, sudden death or even reaction to anesthetic medications also discussed. Alternatives include continued observation.  Benefits include possible symptom relief, prevention of complications including acute cholecystitis, pancreatitis.  Typical post operative recovery of 3-5 days rest, continued pain in area and incision sites, possible loose stools up to 4-6 weeks, also discussed.  ED return precautions given for sudden increase in RUQ pain, with possible accompanying fever, nausea, and/or vomiting.  The patient understands the risks, any and all questions were answered to the patient's satisfaction.  2. Elevated LFTs [R79.89]. Likely chronic cholecystitis Will proceed with robotic assisted laparoscopic cholecystectomy  [phone]  labs/images/medications/previous chart entries reviewed personally and relevant changes/updates noted above.

## 2024-12-10 ENCOUNTER — Other Ambulatory Visit: Payer: Self-pay

## 2024-12-10 ENCOUNTER — Encounter: Payer: Self-pay | Admitting: Surgery

## 2024-12-10 ENCOUNTER — Ambulatory Visit

## 2024-12-10 ENCOUNTER — Encounter
Admission: RE | Disposition: A | Discharge status: Home / Self Care | Payer: Self-pay | Source: Home / Self Care | Attending: Surgery

## 2024-12-10 ENCOUNTER — Ambulatory Visit
Admission: RE | Admit: 2024-12-10 | Discharge: 2024-12-10 | Disposition: A | Discharge status: Home / Self Care | Attending: Surgery | Admitting: Surgery

## 2024-12-10 DIAGNOSIS — Z8379 Family history of other diseases of the digestive system: Secondary | ICD-10-CM | POA: Insufficient documentation

## 2024-12-10 DIAGNOSIS — K219 Gastro-esophageal reflux disease without esophagitis: Secondary | ICD-10-CM | POA: Insufficient documentation

## 2024-12-10 DIAGNOSIS — G4730 Sleep apnea, unspecified: Secondary | ICD-10-CM | POA: Insufficient documentation

## 2024-12-10 DIAGNOSIS — K8010 Calculus of gallbladder with chronic cholecystitis without obstruction: Secondary | ICD-10-CM | POA: Insufficient documentation

## 2024-12-10 DIAGNOSIS — Z818 Family history of other mental and behavioral disorders: Secondary | ICD-10-CM | POA: Insufficient documentation

## 2024-12-10 DIAGNOSIS — F419 Anxiety disorder, unspecified: Secondary | ICD-10-CM | POA: Insufficient documentation

## 2024-12-10 SURGERY — CHOLECYSTECTOMY, ROBOT-ASSISTED, LAPAROSCOPIC
Anesthesia: General | Site: Abdomen

## 2024-12-10 MED ORDER — LACTATED RINGERS IV SOLN: INTRAVENOUS | Status: DC

## 2024-12-10 MED ORDER — CHLORHEXIDINE GLUCONATE 0.12 % MT SOLN
OROMUCOSAL | Status: AC
  Filled 2024-12-10: qty 15

## 2024-12-10 MED ORDER — FENTANYL CITRATE (PF) 100 MCG/2ML IJ SOLN
INTRAMUSCULAR | Status: DC | PRN
  Administered 2024-12-10 (×3): 50 ug via INTRAVENOUS
  Administered 2024-12-10: 100 ug via INTRAVENOUS

## 2024-12-10 MED ORDER — ACETAMINOPHEN 10 MG/ML IV SOLN
INTRAVENOUS | Status: AC
  Filled 2024-12-10: qty 100

## 2024-12-10 MED ORDER — CHLORHEXIDINE GLUCONATE 0.12 % MT SOLN
15.0000 mL | Freq: Once | OROMUCOSAL | Status: AC
  Administered 2024-12-10: 15 mL via OROMUCOSAL

## 2024-12-10 MED ORDER — LIDOCAINE HCL (PF) 2 % IJ SOLN
INTRAMUSCULAR | Status: AC
  Filled 2024-12-10: qty 5

## 2024-12-10 MED ORDER — PROPOFOL 10 MG/ML IV BOLUS
INTRAVENOUS | Status: DC | PRN
  Administered 2024-12-10: 150 mg via INTRAVENOUS

## 2024-12-10 MED ORDER — MIDAZOLAM HCL 2 MG/2ML IJ SOLN
INTRAMUSCULAR | Status: AC
  Filled 2024-12-10: qty 2

## 2024-12-10 MED ORDER — MIDAZOLAM HCL (PF) 2 MG/2ML IJ SOLN
INTRAMUSCULAR | Status: DC | PRN
  Administered 2024-12-10 (×2): 2 mg via INTRAVENOUS

## 2024-12-10 MED ORDER — ONDANSETRON HCL 4 MG/2ML IJ SOLN
INTRAMUSCULAR | Status: AC
  Filled 2024-12-10: qty 2

## 2024-12-10 MED ORDER — BUPIVACAINE-EPINEPHRINE (PF) 0.5% -1:200000 IJ SOLN
INTRAMUSCULAR | Status: DC | PRN
  Administered 2024-12-10: 20 mL
  Administered 2024-12-10: 10 mL

## 2024-12-10 MED ORDER — FENTANYL CITRATE (PF) 100 MCG/2ML IJ SOLN
INTRAMUSCULAR | Status: AC
  Filled 2024-12-10: qty 2

## 2024-12-10 MED ORDER — BUPIVACAINE-EPINEPHRINE (PF) 0.5% -1:200000 IJ SOLN
INTRAMUSCULAR | Status: AC
  Filled 2024-12-10: qty 30

## 2024-12-10 MED ORDER — LIDOCAINE HCL (CARDIAC) PF 100 MG/5ML IV SOSY
PREFILLED_SYRINGE | INTRAVENOUS | Status: DC | PRN
  Administered 2024-12-10: 60 mg via INTRAVENOUS

## 2024-12-10 MED ORDER — ACETAMINOPHEN 10 MG/ML IV SOLN
INTRAVENOUS | Status: DC | PRN
  Administered 2024-12-10: 1000 mg via INTRAVENOUS

## 2024-12-10 MED ORDER — CEFAZOLIN SODIUM-DEXTROSE 2-4 GM/100ML-% IV SOLN
INTRAVENOUS | Status: AC
  Filled 2024-12-10: qty 100

## 2024-12-10 MED ORDER — HYDROMORPHONE HCL 1 MG/ML IJ SOLN
INTRAMUSCULAR | Status: AC
  Filled 2024-12-10: qty 1

## 2024-12-10 MED ORDER — CHLORHEXIDINE GLUCONATE CLOTH 2 % EX PADS: 6.0000 | MEDICATED_PAD | Freq: Once | CUTANEOUS | Status: DC

## 2024-12-10 MED ORDER — SUGAMMADEX SODIUM 200 MG/2ML IV SOLN
INTRAVENOUS | Status: DC | PRN
  Administered 2024-12-10: 272 mg via INTRAVENOUS

## 2024-12-10 MED ORDER — ROCURONIUM BROMIDE 10 MG/ML (PF) SYRINGE
PREFILLED_SYRINGE | INTRAVENOUS | Status: AC
  Filled 2024-12-10: qty 10

## 2024-12-10 MED ORDER — DEXMEDETOMIDINE HCL IN NACL 80 MCG/20ML IV SOLN
INTRAVENOUS | Status: DC | PRN
  Administered 2024-12-10: 12 ug via INTRAVENOUS
  Administered 2024-12-10: 8 ug via INTRAVENOUS

## 2024-12-10 MED ORDER — HYDROMORPHONE HCL 1 MG/ML IJ SOLN
0.2500 mg | INTRAMUSCULAR | Status: DC | PRN
  Administered 2024-12-10 (×4): 0.5 mg via INTRAVENOUS

## 2024-12-10 MED ORDER — DROPERIDOL 2.5 MG/ML IJ SOLN: 0.6250 mg | Freq: Once | INTRAMUSCULAR | Status: DC | PRN

## 2024-12-10 MED ORDER — 0.9 % SODIUM CHLORIDE (POUR BTL) OPTIME
TOPICAL | Status: DC | PRN
  Administered 2024-12-10: 500 mL

## 2024-12-10 MED ORDER — DOCUSATE SODIUM 100 MG PO CAPS
100.0000 mg | ORAL_CAPSULE | Freq: Two times a day (BID) | ORAL | 0 refills | Status: AC | PRN
  Filled 2024-12-10: qty 20, 10d supply, fill #0

## 2024-12-10 MED ORDER — IBUPROFEN 800 MG PO TABS
800.0000 mg | ORAL_TABLET | Freq: Three times a day (TID) | ORAL | 0 refills | Status: AC | PRN
  Filled 2024-12-10: qty 30, 10d supply, fill #0

## 2024-12-10 MED ORDER — ONDANSETRON HCL 4 MG/2ML IJ SOLN
INTRAMUSCULAR | Status: DC | PRN
  Administered 2024-12-10: 4 mg via INTRAVENOUS

## 2024-12-10 MED ORDER — SUCCINYLCHOLINE CHLORIDE 200 MG/10ML IV SOSY
PREFILLED_SYRINGE | INTRAVENOUS | Status: AC
  Filled 2024-12-10: qty 10

## 2024-12-10 MED ORDER — DEXAMETHASONE SOD PHOSPHATE PF 10 MG/ML IJ SOLN
INTRAMUSCULAR | Status: DC | PRN
  Administered 2024-12-10: 10 mg via INTRAVENOUS

## 2024-12-10 MED ORDER — FENTANYL CITRATE (PF) 100 MCG/2ML IJ SOLN: 25.0000 ug | INTRAMUSCULAR | Status: DC | PRN

## 2024-12-10 MED ORDER — SUCCINYLCHOLINE CHLORIDE 200 MG/10ML IV SOSY
PREFILLED_SYRINGE | INTRAVENOUS | Status: DC | PRN
  Administered 2024-12-10: 80 mg via INTRAVENOUS

## 2024-12-10 MED ORDER — ROCURONIUM BROMIDE 100 MG/10ML IV SOLN
INTRAVENOUS | Status: DC | PRN
  Administered 2024-12-10: 25 mg via INTRAVENOUS
  Administered 2024-12-10: 5 mg via INTRAVENOUS
  Administered 2024-12-10: 20 mg via INTRAVENOUS

## 2024-12-10 MED ORDER — ACETAMINOPHEN 325 MG PO TABS
650.0000 mg | ORAL_TABLET | Freq: Three times a day (TID) | ORAL | 0 refills | Status: AC | PRN
  Filled 2024-12-10: qty 40, 7d supply, fill #0

## 2024-12-10 MED ORDER — OXYCODONE-ACETAMINOPHEN 5-325 MG PO TABS
1.0000 | ORAL_TABLET | Freq: Three times a day (TID) | ORAL | 0 refills | Status: AC | PRN
  Filled 2024-12-10: qty 6, 2d supply, fill #0

## 2024-12-10 MED ORDER — ORAL CARE MOUTH RINSE: 15.0000 mL | Freq: Once | OROMUCOSAL | Status: AC

## 2024-12-10 SURGICAL SUPPLY — 34 items
ANCHOR TIS RET SYS 235ML (MISCELLANEOUS) ×1 IMPLANT
BAG PRESSURE INF REUSE 1000 (BAG) IMPLANT
CAUTERY HOOK MNPLR 1.6 DVNC XI (INSTRUMENTS) ×1 IMPLANT
CLIP APPLIE 32.77 55 M/L DVNC (INSTRUMENTS) IMPLANT
CLIP LIGATING HEMO O LOK GREEN (MISCELLANEOUS) ×1 IMPLANT
DEFOGGER SCOPE WARM SEASHARP (MISCELLANEOUS) ×1 IMPLANT
DERMABOND ADVANCED .7 DNX12 (GAUZE/BANDAGES/DRESSINGS) ×1 IMPLANT
DRAPE ARM DVNC X/XI (DISPOSABLE) ×4 IMPLANT
DRAPE C-ARM XRAY 36X54 (DRAPES) IMPLANT
DRAPE COLUMN DVNC XI (DISPOSABLE) ×1 IMPLANT
ELECTRODE REM PT RTRN 9FT ADLT (ELECTROSURGICAL) ×1 IMPLANT
FORCEPS BPLR FENES DVNC XI (FORCEP) ×1 IMPLANT
FORCEPS PROGRASP DVNC XI (FORCEP) ×1 IMPLANT
GLOVE BIOGEL PI IND STRL 7.0 (GLOVE) ×2 IMPLANT
GLOVE SURG SYN 6.5 PF PI (GLOVE) ×4 IMPLANT
GOWN STRL REUS W/ TWL LRG LVL3 (GOWN DISPOSABLE) ×4 IMPLANT
GRASPER SUT TROCAR 14GX15 (MISCELLANEOUS) IMPLANT
IRRIGATOR SUCT 8 DISP DVNC XI (IRRIGATION / IRRIGATOR) IMPLANT
IV 0.9% NACL 1000 ML (IV SOLUTION) IMPLANT
KIT TURNOVER KIT A (KITS) ×1 IMPLANT
LABEL OR SOLS (LABEL) ×1 IMPLANT
MANIFOLD NEPTUNE II (INSTRUMENTS) ×1 IMPLANT
NDL HYPO 22X1.5 SAFETY MO (MISCELLANEOUS) ×1 IMPLANT
NDL INSUFFLATION 14GA 120MM (NEEDLE) ×1 IMPLANT
NS IRRIG 500ML POUR BTL (IV SOLUTION) ×1 IMPLANT
OBTURATOR OPTICALSTD 8 DVNC (TROCAR) ×1 IMPLANT
PACK LAP CHOLECYSTECTOMY (MISCELLANEOUS) ×1 IMPLANT
SEAL UNIV 5-12 XI (MISCELLANEOUS) ×4 IMPLANT
SET TUBE SMOKE EVAC HIGH FLOW (TUBING) ×1 IMPLANT
SOLN STERILE WATER 500 ML (IV SOLUTION) ×1 IMPLANT
SOLUTION ELECTROSURG ANTI STCK (MISCELLANEOUS) ×1 IMPLANT
SPIKE FLUID TRANSFER (MISCELLANEOUS) ×2 IMPLANT
SUT VICRYL 0 UR6 27IN ABS (SUTURE) ×1 IMPLANT
SUTURE MNCRL 4-0 27XMF (SUTURE) ×2 IMPLANT

## 2024-12-10 NOTE — Discharge Instructions (Signed)
 Laparoscopic Cholecystectomy, Care After This sheet gives you information about how to care for yourself after your procedure. Your doctor may also give you more specific instructions. If you have problems or questions, contact your doctor. Follow these instructions at home: Care for cuts from surgery (incisions)  Follow instructions from your doctor about how to take care of your cuts from surgery. Make sure you: Wash your hands with soap and water before you change your bandage (dressing). If you cannot use soap and water, use hand sanitizer. Change your bandage as told by your doctor. Leave stitches (sutures), skin glue, or skin tape (adhesive) strips in place. They may need to stay in place for 2 weeks or longer. If tape strips get loose and curl up, you may trim the loose edges. Do not remove tape strips completely unless your doctor says it is okay. Do not take baths, swim, or use a hot tub until your doctor says it is okay. OK TO SHOWER 24HRS AFTER YOUR SURGERY.  Check your surgical cut area every day for signs of infection. Check for: More redness, swelling, or pain. More fluid or blood. Warmth. Pus or a bad smell. Activity Do not drive or use heavy machinery while taking prescription pain medicine. Do not play contact sports until your doctor says it is okay. Do not drive for 24 hours if you were given a medicine to help you relax (sedative). Rest as needed. Do not return to work or school until your doctor says it is okay. General instructions  tylenol and advil as needed for discomfort.  Please alternate between the two every four hours as needed for pain.    Use narcotics, if prescribed, only when tylenol and motrin is not enough to control pain.  325-650mg  every 8hrs to max of 3000mg /24hrs (including the 325mg  in every norco dose) for the tylenol.    Advil up to 800mg  per dose every 8hrs as needed for pain.   To prevent or treat constipation while you are taking prescription  pain medicine, your doctor may recommend that you: Drink enough fluid to keep your pee (urine) clear or pale yellow. Take over-the-counter or prescription medicines. Eat foods that are high in fiber, such as fresh fruits and vegetables, whole grains, and beans. Limit foods that are high in fat and processed sugars, such as fried and sweet foods. Contact a doctor if: You develop a rash. You have more redness, swelling, or pain around your surgical cuts. You have more fluid or blood coming from your surgical cuts. Your surgical cuts feel warm to the touch. You have pus or a bad smell coming from your surgical cuts. You have a fever. One or more of your surgical cuts breaks open. You have trouble breathing. You have chest pain. You have pain that is getting worse in your shoulders. You faint or feel dizzy when you stand. You have very bad pain in your belly (abdomen). You are sick to your stomach (nauseous) for more than one day. You have throwing up (vomiting) that lasts for more than one day. You have leg pain. This information is not intended to replace advice given to you by your health care provider. Make sure you discuss any questions you have with your health care provider. Document Released: 09/25/2008 Document Revised: 07/07/2016 Document Reviewed: 06/04/2016 Elsevier Interactive Patient Education  2019 [employer].

## 2024-12-10 NOTE — Interval H&P Note (Signed)
 No change. Ok to proceed

## 2024-12-10 NOTE — Anesthesia Procedure Notes (Signed)
 Procedure Name: Intubation Date/Time: 12/10/2024 2:38 PM  Performed by: Veronica Alm BROCKS, CRNAPre-anesthesia Checklist: Patient identified, Patient being monitored, Timeout performed, Emergency Drugs available and Suction available Patient Re-evaluated:Patient Re-evaluated prior to induction Oxygen Delivery Method: Circle system utilized Preoxygenation: Pre-oxygenation with 100% oxygen Induction Type: IV induction Ventilation: Mask ventilation without difficulty Laryngoscope Size: McGrath and 4 Grade View: Grade I Tube type: Oral Tube size: 7.0 mm Number of attempts: 1 Airway Equipment and Method: Stylet Placement Confirmation: ETT inserted through vocal cords under direct vision, positive ETCO2 and breath sounds checked- equal and bilateral Secured at: 21 cm Tube secured with: Tape Dental Injury: Teeth and Oropharynx as per pre-operative assessment

## 2024-12-10 NOTE — Transfer of Care (Signed)
 Immediate Anesthesia Transfer of Care Note  Patient: Vanessa Kim  Procedure(s) Performed: Procedures: CHOLECYSTECTOMY, ROBOT-ASSISTED, LAPAROSCOPIC (N/A)  Patient Location: PACU  Anesthesia Type:General  Level of Consciousness: sedated  Airway & Oxygen Therapy: Patient Spontanous Breathing and Patient connected to face mask oxygen  Post-op Assessment: Report given to RN and Post -op Vital signs reviewed and stable  Post vital signs: Reviewed and stable  Last Vitals:  Vitals:   12/10/24 1351 12/10/24 1601  BP: (!) 150/88 (!) 209/120  Pulse: 70 69  Resp: 16 20  Temp: (!) 36.1 C (!) 36 C  SpO2: 100% 100%    Complications: No apparent anesthesia complications

## 2024-12-10 NOTE — Anesthesia Preprocedure Evaluation (Signed)
 Anesthesia Evaluation  Patient identified by MRN, date of birth, ID band Patient awake    Reviewed: Allergy & Precautions, H&P , NPO status , Patient's Chart, lab work & pertinent test results, reviewed documented beta blocker date and time   History of Anesthesia Complications Negative for: history of anesthetic complications  Airway Mallampati: II  TM Distance: >3 FB Neck ROM: full    Dental  (+) Dental Advidsory Given, Missing, Teeth Intact   Pulmonary neg shortness of breath, sleep apnea , neg COPD, neg recent URI   Pulmonary exam normal breath sounds clear to auscultation       Cardiovascular Exercise Tolerance: Good negative cardio ROS Normal cardiovascular exam Rhythm:regular Rate:Normal     Neuro/Psych  PSYCHIATRIC DISORDERS Anxiety     negative neurological ROS     GI/Hepatic Neg liver ROS,GERD  Poorly Controlled,,  Endo/Other  negative endocrine ROS    Renal/GU negative Renal ROS  negative genitourinary   Musculoskeletal   Abdominal   Peds  Hematology negative hematology ROS (+)   Anesthesia Other Findings Past Medical History: No date: Anxiety 05/28/2012: Dysplastic nevus     Comment:  Back, severe. Excision. 06/23/2018: Dysplastic nevus     Comment:  R mid back lateral at side 04/14/2020: Dysplastic nevus     Comment:  R side inf to bra - moderate   Reproductive/Obstetrics negative OB ROS                              Anesthesia Physical Anesthesia Plan  ASA: 2  Anesthesia Plan: General   Post-op Pain Management:    Induction: Intravenous  PONV Risk Score and Plan: 3 and Ondansetron, Dexamethasone, Midazolam and Treatment may vary due to age or medical condition  Airway Management Planned: Oral ETT  Additional Equipment:   Intra-op Plan:   Post-operative Plan: Extubation in OR  Informed Consent: I have reviewed the patients History and Physical, chart,  labs and discussed the procedure including the risks, benefits and alternatives for the proposed anesthesia with the patient or authorized representative who has indicated his/her understanding and acceptance.     Dental Advisory Given  Plan Discussed with: Anesthesiologist, CRNA and Surgeon  Anesthesia Plan Comments:         Anesthesia Quick Evaluation

## 2024-12-10 NOTE — Op Note (Signed)
 Preoperative diagnosis:  chronic and cholecystitis  Postoperative diagnosis: same as above  Procedure: Robotic assisted Laparoscopic Cholecystectomy.   Anesthesia: GETA   Surgeon: Henriette Pierre  Specimen: Gallbladder  Complications: None  EBL: 15mL  Wound Classification: Clean Contaminated  Indications: see HPI  Findings: Critical view of safety noted Cystic duct and artery identified, ligated and divided, clips remained intact at end of procedure Adequate hemostasis  Description of procedure:  The patient was placed on the operating table in the supine position. SCDs placed, pre-op abx administered.  General anesthesia was induced and OG tube placed by anesthesia. A time-out was completed verifying correct patient, procedure, site, positioning, and implant(s) and/or special equipment prior to beginning this procedure. The abdomen was prepped and draped in the usual sterile fashion.    Veress needle was placed at the Palmer's point and insufflation was started after confirming a positive saline drop test and no immediate increase in abdominal pressure.  After reaching 15 mm, the Veress needle was removed and a 8 mm port was placed via optiview technique under umbilicus measured 20mm from gallbladder.  The abdomen was inspected and no abnormalities or injuries were found.  Under direct vision, ports were placed in the following locations: One 12 mm patient left of the umbilicus, 8cm from the periumbilical port, one 8 mm port placed to the patient right of the umbilical port 8 cm apart.  1 additional 8 mm port placed lateral to the 12mm port.  Once ports were placed, The table was placed in the reverse Trendelenburg position with the right side up. The Xi platform was brought into the operative field and docked to the ports successfully.  An endoscope was placed through the umbilical port, fenestrated grasper through the adjacent patient right port, prograsp to the far patient left port, and  then a hook cautery in the left port.  The dome of the gallbladder was grasped with prograsp, passed and retracted over the dome of the liver. Adhesions between the gallbladder and omentum, duodenum and transverse colon were lysed via hook cautery. The infundibulum was grasped with the fenestrated grasper and retracted toward the right lower quadrant. This maneuver exposed Calot's triangle. The peritoneum overlying the gallbladder infundibulum was then dissected  and the cystic duct and cystic artery identified.  Critical view of safety with the liver bed clearly visible behind the duct and artery with no additional structures noted.  The cystic duct and cystic artery clipped and divided close to the gallbladder.     The gallbladder was then dissected from its peritoneal and liver bed attachments by electrocautery. Hemostasis was checked prior to removing the hook cautery and the Endo Catch bag was then placed through the 12 mm port and the gallbladder was removed.  The gallbladder was passed off the table as a specimen. There was no evidence of bleeding from the gallbladder fossa or cystic artery or leakage of the bile from the cystic duct stump. The 12 mm port site closed with PMI using 0 vicryl under direct vision.  Abdomen desufflated and secondary trocars were removed under direct vision. No bleeding was noted. All skin incisions then closed with subcuticular sutures of 4-0 monocryl and dressed with topical skin adhesive. The orogastric tube was removed and patient extubated.  The patient tolerated the procedure well and was taken to the postanesthesia care unit in stable condition.  All sponge and instrument count correct at end of procedure.

## 2024-12-11 ENCOUNTER — Other Ambulatory Visit: Payer: Self-pay

## 2024-12-14 LAB — SURGICAL PATHOLOGY

## 2024-12-16 ENCOUNTER — Other Ambulatory Visit: Payer: Self-pay

## 2024-12-17 ENCOUNTER — Other Ambulatory Visit: Payer: Self-pay

## 2024-12-17 MED ORDER — AMPHETAMINE-DEXTROAMPHETAMINE 10 MG PO TABS
10.0000 mg | ORAL_TABLET | Freq: Two times a day (BID) | ORAL | 0 refills | Status: DC
  Filled 2024-12-17: qty 60, 30d supply, fill #0

## 2024-12-17 MED ORDER — MONTELUKAST SODIUM 10 MG PO TABS
10.0000 mg | ORAL_TABLET | Freq: Every day | ORAL | 3 refills | Status: AC
  Filled 2024-12-17: qty 90, 90d supply, fill #0

## 2024-12-17 MED ORDER — ESTRADIOL 0.05 MG/24HR TD PTWK
0.0500 mg | MEDICATED_PATCH | TRANSDERMAL | 11 refills | Status: AC
  Filled 2024-12-17: qty 4, 28d supply, fill #0
  Filled 2024-12-25 – 2024-12-26 (×2): qty 4, 28d supply, fill #1
  Filled 2025-01-20: qty 4, 28d supply, fill #2

## 2024-12-20 NOTE — Anesthesia Postprocedure Evaluation (Signed)
"   Anesthesia Post Note  Patient: Vanessa Kim  Procedure(s) Performed: CHOLECYSTECTOMY, ROBOT-ASSISTED, LAPAROSCOPIC (Abdomen)  Patient location during evaluation: PACU Anesthesia Type: General Level of consciousness: awake and alert Pain management: pain level controlled Vital Signs Assessment: post-procedure vital signs reviewed and stable Respiratory status: spontaneous breathing, nonlabored ventilation, respiratory function stable and patient connected to nasal cannula oxygen Cardiovascular status: blood pressure returned to baseline and stable Postop Assessment: no apparent nausea or vomiting Anesthetic complications: no   No notable events documented.   Last Vitals:  Vitals:   12/10/24 1736 12/10/24 1753  BP: (!) 160/104 (!) 166/97  Pulse: 60 65  Resp:  17  Temp: (!) 36.2 C   SpO2: 100% 98%    Last Pain:  Vitals:   12/10/24 1753  TempSrc:   PainSc: 4                  Prentice Murphy      "

## 2024-12-26 ENCOUNTER — Other Ambulatory Visit (HOSPITAL_COMMUNITY): Payer: Self-pay

## 2024-12-26 ENCOUNTER — Other Ambulatory Visit: Payer: Self-pay

## 2025-01-20 ENCOUNTER — Other Ambulatory Visit: Payer: Self-pay

## 2025-01-22 ENCOUNTER — Other Ambulatory Visit: Payer: Self-pay

## 2025-01-22 MED ORDER — AMPHETAMINE-DEXTROAMPHETAMINE 10 MG PO TABS
10.0000 mg | ORAL_TABLET | Freq: Two times a day (BID) | ORAL | 0 refills | Status: AC
  Filled 2025-01-22: qty 60, 30d supply, fill #0

## 2025-01-22 MED ORDER — ALPRAZOLAM 0.25 MG PO TABS
0.2500 mg | ORAL_TABLET | Freq: Two times a day (BID) | ORAL | 5 refills | Status: AC
  Filled 2025-01-22: qty 30, 15d supply, fill #0
